# Patient Record
Sex: Female | Born: 1950 | Race: White | Marital: Single | Smoking: Never smoker
Health system: Northeastern US, Academic
[De-identification: ages and names within clinical notes are randomized; demographics above are authoritative.]

## PROBLEM LIST (undated history)

## (undated) DIAGNOSIS — J449 Chronic obstructive pulmonary disease, unspecified: Secondary | ICD-10-CM

## (undated) DIAGNOSIS — K589 Irritable bowel syndrome without diarrhea: Secondary | ICD-10-CM

## (undated) DIAGNOSIS — E119 Type 2 diabetes mellitus without complications: Secondary | ICD-10-CM

## (undated) DIAGNOSIS — M549 Dorsalgia, unspecified: Secondary | ICD-10-CM

## (undated) DIAGNOSIS — I10 Essential (primary) hypertension: Secondary | ICD-10-CM

## (undated) DIAGNOSIS — M1990 Unspecified osteoarthritis, unspecified site: Secondary | ICD-10-CM

## (undated) DIAGNOSIS — K219 Gastro-esophageal reflux disease without esophagitis: Secondary | ICD-10-CM

## (undated) DIAGNOSIS — N200 Calculus of kidney: Secondary | ICD-10-CM

## (undated) DIAGNOSIS — K760 Fatty (change of) liver, not elsewhere classified: Secondary | ICD-10-CM

## (undated) DIAGNOSIS — E079 Disorder of thyroid, unspecified: Secondary | ICD-10-CM

## (undated) DIAGNOSIS — G8929 Other chronic pain: Secondary | ICD-10-CM

## (undated) HISTORY — DX: Gastro-esophageal reflux disease without esophagitis: K21.9

## (undated) HISTORY — DX: Calculus of kidney: N20.0

## (undated) HISTORY — PX: OVARY REMOVAL: SHX86

## (undated) HISTORY — PX: KNEE SURGERY: SHX244

## (undated) HISTORY — DX: Fatty (change of) liver, not elsewhere classified: K76.0

## (undated) HISTORY — DX: Irritable bowel syndrome without diarrhea: K58.9

## (undated) HISTORY — DX: Type 2 diabetes mellitus without complications: E11.9

## (undated) HISTORY — DX: Irritable bowel syndrome, unspecified: K58.9

## (undated) HISTORY — DX: Disorder of thyroid, unspecified: E07.9

## (undated) HISTORY — DX: Chronic obstructive pulmonary disease, unspecified: J44.9

## (undated) HISTORY — DX: Unspecified osteoarthritis, unspecified site: M19.90

## (undated) HISTORY — DX: Other chronic pain: G89.29

## (undated) HISTORY — PX: ACHILLES TENDON SURGERY: SHX542

## (undated) HISTORY — DX: Dorsalgia, unspecified: M54.9

## (undated) HISTORY — DX: Essential (primary) hypertension: I10

## (undated) HISTORY — PX: HYSTERECTOMY: SHX81

---

## 2009-07-02 ENCOUNTER — Emergency Department
Admit: 2009-07-02 | Disposition: A | Discharge status: Home / Self Care | Payer: Self-pay | Source: Ambulatory Visit | Attending: Emergency Medicine | Admitting: Emergency Medicine

## 2009-07-02 LAB — CBC AND DIFFERENTIAL
Baso # K/uL: 0 THOU/uL (ref 0.0–0.1)
Basophil %: 0 % (ref 0.1–1.2)
Eos # K/uL: 0 THOU/uL (ref 0.0–0.4)
Eosinophil %: 0 % — ABNORMAL LOW (ref 0.7–5.8)
Hematocrit: 39 % (ref 34–45)
Hemoglobin: 13.1 g/dL (ref 11.2–15.7)
Lymph # K/uL: 1.4 THOU/uL (ref 1.2–3.7)
Lymphocyte %: 12 % — ABNORMAL LOW (ref 19.3–51.7)
MCV: 88 fL (ref 79–95)
Mono # K/uL: 0.4 THOU/uL (ref 0.2–0.9)
Monocyte %: 4 % — ABNORMAL LOW (ref 4.7–12.5)
Neut # K/uL: 8.6 THOU/uL — ABNORMAL HIGH (ref 1.6–6.1)
Platelets: 125 THOU/uL — ABNORMAL LOW (ref 160–370)
RBC: 4.5 MIL/uL (ref 3.9–5.2)
RDW: 13.4 % (ref 11.7–14.4)
Seg Neut %: 83 % — ABNORMAL HIGH (ref 34.0–71.1)
WBC: 10.4 THOU/uL — ABNORMAL HIGH (ref 4.0–10.0)

## 2009-07-02 LAB — COMPREHENSIVE METABOLIC PANEL
ALT: 60 U/L — ABNORMAL HIGH (ref 0–35)
AST: 46 U/L — ABNORMAL HIGH (ref 0–35)
Albumin: 4.2 g/dL (ref 3.5–5.2)
Alk Phos: 145 U/L — ABNORMAL HIGH (ref 35–105)
Anion Gap: 13 (ref 7–16)
Bilirubin,Total: 1.4 mg/dL — ABNORMAL HIGH (ref 0.0–1.2)
CO2: 29 mmol/L — ABNORMAL HIGH (ref 20–28)
Calcium: 8.9 mg/dL (ref 8.6–10.2)
Chloride: 97 mmol/L (ref 96–108)
Creatinine: 1.26 mg/dL — ABNORMAL HIGH (ref 0.51–0.95)
GFR,Black: 53 * — AB
GFR,Caucasian: 43 * — AB
Globulin: 2.3 g/dL — ABNORMAL LOW (ref 2.7–4.3)
Glucose: 109 mg/dL — ABNORMAL HIGH (ref 74–106)
Lab: 15 mg/dL (ref 6–20)
Potassium: 3.4 mmol/L (ref 3.3–5.1)
Sodium: 139 mmol/L (ref 133–145)
Total Protein: 6.5 g/dL (ref 6.3–7.7)

## 2009-07-02 LAB — REACTIVE LYMPHS: React Lymph %: 1 % (ref 0.0–6.0)

## 2009-07-02 LAB — BLOOD BANK HOLD PINK

## 2009-07-02 LAB — HOLD BLUE

## 2009-07-02 LAB — DIFF BASED ON: Diff Based On: 100 CELLS

## 2009-07-02 LAB — SLIDE NUMBER: Slide # (Heme): 1983

## 2009-07-02 LAB — HOLD GREEN WITH GEL

## 2009-07-02 LAB — MANUAL DIFFERENTIAL

## 2009-07-08 LAB — BLOOD CULTURE
Bacterial Blood Culture: NO GROWTH
Bacterial Blood Culture: NO GROWTH

## 2009-09-24 ENCOUNTER — Ambulatory Visit: Payer: Self-pay | Admitting: Orthopedic Surgery

## 2009-09-24 ENCOUNTER — Ambulatory Visit: Payer: Self-pay

## 2009-09-29 NOTE — Progress Notes (Signed)
 CHIEF COMPLAINT:  Lisa Ferguson is a 59 year old female who [facility] consultation in regards to her left great toe.    HISTORY OF PRESENT ILLNESS:  The patient states that she has had left great  toe pain over the last year.  It is aggravated by activity.  It is relieved  by rest.  She has had normal treatment to date for it.  She states her pain  currently is 1/10.  At its worst, it is a 7/10.  It is localized to the  great toe.  It is achy in nature.    PAST MEDICAL HISTORY, PAST SURGICAL HISTORY, MEDICATIONS, ALLERGIES, SOCIAL  HISTORY, FAMILY HISTORY, REVIEW OF SYSTEMS:  Reviewed in the clinic today  and entered in the patient medical record via the patient questionnaire.  She is a known diabetic.  She has been on insulin over the last 1 to 2  months.  A post hemoglobin A-1-C was 9, however, more recently since  starting on insulin, her hemoglobin A-1-C is now down to around 7.  She is  a nonsmoker.  She is not limited in her walking by her toe, and she  exercises irregularly.    PHYSICAL EXAMINATION:  On examination, she is an obese female.  She is in  no acute distress in the clinic today.  She is alert and oriented and  cooperative with the exam.  Standing alignment reveals that she has  bilateral hindfoot valgus, more pronounced on the left than on the right.  She has collapsed through her midfoot bilaterally.  She has palpable pulses  bilaterally.  Feet are neurovascularly intact to light touch sensation.  There is no ulceration on either foot.  Examination of her left foot  reveals callus formation at the IP joint of her great toe.  She has some  reduced motion at the MTP joint of the left toe with mild crepitus.  Today,  this is nonpainful.  She has a dorsal medial prominence of the left great  toe.  This is minimally tender to palpation today.  She has dorsiflex to  neutral with her hindfoot corrected which does improve with her knee  flexed.    IMAGING STUDIES:   Weightbearing x-rays of the left foot were performed  today.  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  They also show  the flatfoot deformity.    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    PLAN:  We have prescribed for her diabetic insoles and she has seen the  Prosthetics and Orthotics Department here to obtain the insoles.  We have  advised her on appropriate footwear and foot care for diabetes.  She will  follow up with [place] on a p.r.n. basis.    The patient was seen and assessed with Dr. Theador Hawthorne and patient care plan  agreed upon.        Dictated by:  Michae Kava, MD,FEL      I saw and evaluated the patient.  I agree with the resident's/fellow's  findings and plan of care as documented above.    Electronically Signed and Finalized  by  Zachary George, MD 09/29/2009  07:29  ___________________________________________  Zachary George, MD      DD:   09/24/2009  DT:   09/24/2009 10:38 A  [id number]  [id number]    cc:   Shane Crutch, MD

## 2013-03-24 ENCOUNTER — Ambulatory Visit: Payer: Self-pay | Admitting: Orthopedic Surgery

## 2013-03-24 ENCOUNTER — Encounter: Payer: Self-pay | Admitting: Orthopedic Surgery

## 2013-03-24 ENCOUNTER — Ambulatory Visit
Admit: 2013-03-24 | Discharge: 2013-03-24 | Disposition: A | Discharge status: Home / Self Care | Payer: Self-pay | Source: Ambulatory Visit | Attending: Orthopedic Surgery | Admitting: Orthopedic Surgery

## 2013-03-24 VITALS — Ht 70.0 in | Wt 235.0 lb

## 2013-03-24 DIAGNOSIS — M1610 Unilateral primary osteoarthritis, unspecified hip: Secondary | ICD-10-CM

## 2013-03-24 DIAGNOSIS — M25559 Pain in unspecified hip: Secondary | ICD-10-CM

## 2013-03-24 NOTE — Progress Notes (Signed)
ADULT NEW HIP PAIN    Lisa Ferguson presents today for a new patient visit with complaints of right hip pain. Pain has been present for 2 years.  The pain is located lateral and groin, buttock. It is exacerbated with activity, and relieved by rest. Treatment modalities so far areinjection, medication and therapy. The pain is worse with time.  There  is pain at rest. Pain is rated as a 5 out of 10 when at its worst.  There is not a history of prior injury and previous problems.  Patient complains of back pain.  Radiographs have been taken previously.     Past Medical History: History reviewed. No pertinent past medical history.      Past Surgical History: History reviewed. No pertinent past surgical history.     Medications: No current outpatient prescriptions on file.     Allergies:   Allergies   Allergen Reactions   . 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     Review of Systems: Review of Systems    Constitutional        []  Fever        []  Chills        []  Loss of Appetite         []  Unexplained Weight Loss        []  Unusual Tiredness        []  Night Pains    Ears, Nose, Mouth, Throat        []  Dizziness        []  Mouth Sores        []  Sore Throat    Cardiovascular        [x]  High Blood Pressure        []  Angina        []  Trouble Breathing         []  Trouble Breathing When Flat        []  Ankle Swelling        []  Heart Attack        []  Congestive Heart Failure        []  Mitral Valve Prolapse        []  Abnormal Heart Rhythm        []  Heart Murmur    Respiratory        []  Heavy Cough        []  Cough Up Sputum        []  Cough Up Blood         []  Pneumonia        []  Asthma    Neurological        []  Fainting        []  Epilepsy        []  Stroke         []  Memory  Problems    Eyes        []  Abnormal Vision        []  Glasses or Contacts    Gastrointestinal        []  Nausea        []  Stomach Pain        []   Vomiting         []  Vomiting Blood        []  Ulcers        []  Hiatal Hernia        []  Constipation        []  Diarrhea        []  Change in Bowel Habits        []  Blood in Stool        []  Black, Tarry Stools        []  Reflux        []  GERD    Genitourinary        []  Painful Urination        []  Impotence        []  One Kidney         []  Kidney Failure        []  Dialysis        []  Kidney Transplant        []  Change in Bladder Habits        []  Incontinence        []  Prostate Cancer    Musculoskeletal        []  Painful Joints        []  Swollen Joints        []  Redness of Joints        []  Joint Infection        []  Bone Infection        []  Gout        []  Osteoarthritis        []  Rheumatoid Arthritis        []  Ankylosing Spondylitis        []  Osteoporosis    Skin        []  Skin Sores        []  Skin Rash        []  Itching        []  Skin Cancer    Psychiatric        []  Depression        []  Anxiety    Endocrine        [x]  Diabetes        [x]  Thyroid Problem    Hematologic/Lymphatic        []  Unusual Bleeding        []  Easy Bruising        []  Mass        []  Swollen Glands        []  Anemia        []  Infection        []  Immunodeficiency        []  Hepatitis        []  Cancer       Exam: This patient is pleasant and co-operative,alert and oriented x3, and height/weight per epic. Normocephalic, atraumatic, neck symmetric. Vital signs per chart.   There isnormal alignment of the limb.  Swelling is absent.  There is tenderness to palpation .  Range of motion is full.  Leg lengths are even.  Sensation is  normal  Circulation is normal. Color to skin is normal.     Radiographs are personally reviewed and reveal no abnormality     Impression:  Ongoing pain  right hip.        Plan:    MRI - call following test.    Orders Next Visit:

## 2013-04-11 ENCOUNTER — Telehealth: Payer: Self-pay | Admitting: Orthodontics

## 2013-04-11 NOTE — Telephone Encounter (Signed)
Can you please call patient with MRI results she can be reached at [phone]

## 2013-04-12 ENCOUNTER — Telehealth: Payer: Self-pay | Admitting: Orthodontics

## 2013-04-12 DIAGNOSIS — M25559 Pain in unspecified hip: Secondary | ICD-10-CM

## 2013-04-12 NOTE — Telephone Encounter (Signed)
Patient had MRI done want to know the results can you call her at [phone]

## 2013-04-12 NOTE — Telephone Encounter (Signed)
MRI results of R hip reviewed and RX for PT given to pt.

## 2013-04-25 ENCOUNTER — Ambulatory Visit: Payer: Self-pay | Admitting: Rehabilitative and Restorative Service Providers"

## 2013-04-25 DIAGNOSIS — M25559 Pain in unspecified hip: Secondary | ICD-10-CM

## 2013-04-25 NOTE — Progress Notes (Signed)
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 female who is present today for right hip care.   Mechanism of injury/history of symptoms:  No specific cause  Reports her hip has been bothering her for a couple of months and it just was not going away.  Walking and stairs bother her.  Attempted chiropractor and massage therapy, had an injection and only helped for a little bit.  Had MRI which showed the glut tears.   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, Gardening/Yard Work and Psychiatric nurse): Golf and Walking  Other: NA  Diagnostic tests: MRI    Symptom location: Posterior and Anterior, right  Relevant symptoms:  Aching, Pain   Symptom frequency: Constant  Symptom intensity:  (0 - 10 scale): Now 5 Best 2 Worst 8   Night Pain: No   Restful sleep:   Yes  Morning Pain/Stiffness: Unchanged   Symptoms worsen with: Stairs, Walking  Symptoms improve with: Rest, Ice, Medication  Assistive device:  none  Patient's goals for therapy: Reduce pain, Increase strength, Independent with home program     Objective    Observation: WNL  Gait:  Normal    Lumbar Screen:  WNL  Neurologic:  Sensation:  Light touch, Intact to gross screen    Palpation:  tenderness @ lateral aspect of hip  Incision:  NA      ROM/Strength      HIP LEFT RIGHT STRENGTH    PROM AROM PROM AROM Left Right   Flexion      5 5   Extension     5 5   Abduction     5 3+   IR WNL WNL   4+ 4+   ER WNL WNL   4+ 4+   Adduction     5 5                     SINGLE LEG STANDING BALANCE (# contacts / 30 sec)    LEFT:    0 contacts / eyes open    RIGHT:  0 contacts / eyes open      Special Tests:   Hip Patrick's / Pearlean Brownie,   negative  Straight Leg Raise,  negative  FADIR,  negative  Positive trendelenberg,   (+)HS tightness, (-)Ober   Knee N/A   Ankle N/A          Functional:  Walk more than 15 minutes - unable to perform without increased pain.  Ascend stairs with reciprocal gait - unable to perform without increased pain.  Descend stairs with reciprocal gait - unable to perform without increased pain.    Assessment:    Findings consistent with 62 y.o., female with right hip partial glut med/min tears with pain, ROM limitations, strength limitations, functional limitations    Prognosis:  Good   Contraindications/Precautions/Limitation:  Per diagnosis  Short Term Goals: (4 week(s)): Decrease c/o pain by 50% with ADL and exercise , Increase strength 4+/5 in all directions and Minimal assistance with HEP/ education concepts  Long Term Goals: (3 month(s)): Pain/Sx 0 - minimal, ROM/  flexibility WNL , Restoration of functional strength, Independent with HEP/education , Functional return to ADLs / activites without limitation     Treatment Plan:   Options / plan reviewed with patient:  Yes  Freq 1-2 times per week for 3 month(s)    Treatment plan inclusive of:   Exercise: AROM, AAROM, PROM, Stretching, Progressive Resistive   Manual Techniques:  N/A   Modalities:  Cold pack, Ther Exercise per flowsheet   Functional: Proprioception/Dynamic stability, Functional rehab    Thank you for referring this patient to [facility].    Rueben Bash Chriss Mannan, PT    Hamstring stretch with strap 3 x 30''   Up and over stretch 3 x 30''   Supine piriformis stretch 3 x 30''   Curl up x10 each   Hip abd iso with belt x10   Bridge  x5   clams x10

## 2013-05-03 ENCOUNTER — Ambulatory Visit: Payer: Self-pay | Admitting: Rehabilitative and Restorative Service Providers"

## 2013-05-03 DIAGNOSIS — M25559 Pain in unspecified hip: Secondary | ICD-10-CM

## 2013-05-03 NOTE — Progress Notes (Signed)
[facility]  PT Note    Lisa Ferguson   [id number]     Diagnosis: Right hip partial tear of glut med/min, degenerative labral tearing    Subjective:  Pain Score:  3  Pain: Improved, reports some days are better than others.  Notices when she overdoes it.    Objective:  ROM - Improved, Right, Hip, flexibility as seen grossly  Strength - Improved,  Per function, Per Ther Ex flowsheet, Per patient report  Function: - Improved  Education:  Updated HEP, Verbal cues for ther ex, Manual cues for ther ex    Objective         Treatment:  Ther Exercise per flowsheet, denies ice    Assessment:  Patient responding favorably, Continue present therapy treatment   Patient tolerated progressions with exercises well.  She will be out of town for the next two and a half weeks and follow up when she returns.  She was instructed to call with any questions or concerns.        Plan of Care:  Continue per Plan of care -  As written    Thank you for referring this patient to [facility]    [employer], PT        Bike  10 min   Hamstring stretch with strap 3 x 30''   Up and over stretch 3 x 30''   Supine piriformis stretch 3 x 30''   Curl up x10 each   Hip abd iso with belt 3x10   Bridge with belt x15   clams 3x10   sidelying hip abd 2 x 10   Prone extension 2 x 10   Mini squat 2 x 10

## 2013-05-24 ENCOUNTER — Ambulatory Visit: Payer: Self-pay | Admitting: Rehabilitative and Restorative Service Providers"

## 2013-05-30 ENCOUNTER — Ambulatory Visit: Payer: Self-pay | Admitting: Rehabilitative and Restorative Service Providers"

## 2013-05-30 DIAGNOSIS — M25559 Pain in unspecified hip: Secondary | ICD-10-CM

## 2013-05-30 NOTE — Progress Notes (Signed)
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 Has been traveling over the past month so has been unable to attend PT visits.    Objective:  ROM - Improved, Right, Hip, flexibility as seen grossly  Strength - Improved,  Per function, Per Ther Ex flowsheet, Per patient report  Function: - Improved  Education:  Updated HEP, Verbal cues for ther ex, Manual cues for ther ex    Objective         Treatment:  Ther Exercise per flowsheet, denies ice    Assessment:  Patient responding favorably, Continue present therapy treatment   Progressed exercises as below.  Patient tolerated well.  Will continue to progress as tolerated.  She will be out of town over the next 2 weeks and will follow up upon her return.       Plan of Care:  Continue per Plan of care -  As written    Thank you for referring this patient to [facility]    [employer], PT        Bike  10 min   Hamstring stretch with strap 3 x 30''   Up and over stretch 3 x 30''   Supine piriformis stretch 3 x 30''   Curl up x10 each   Hip abd iso with belt 3x10   Bridge with belt x15   clams 3x10   sidelying hip abd 2 x 10   Prone extension 2 x 10   Mini squat 2 x 10   SLB 4 x 30''   Step up/down 4 inch 2 x 10   Lateral step up 4 inch 2 x 10

## 2013-08-17 ENCOUNTER — Encounter: Payer: Self-pay | Admitting: Rehabilitative and Restorative Service Providers"

## 2013-08-17 NOTE — Progress Notes (Signed)
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 in HEP  Other:  NA    REASON FOR DISCHARGE:  Patient did not return for follow-up     Prognosis at time of discharge:  good  Comments:  NA    Discharge planning:  Discussion regarding the maintenance of appropriate exercise and activity as part of a healthy lifestyle.  Optional utilization of Post-Rehab Conditioning Program [facility]) for transition and/or additional exercise/activity support    Thank you for the referral of this patient to [facility].    Isabella Bowens, PT

## 2016-11-11 ENCOUNTER — Encounter: Payer: Self-pay | Admitting: Gastroenterology

## 2017-02-03 ENCOUNTER — Ambulatory Visit: Payer: Medicare (Managed Care) | Attending: Pediatrics | Admitting: Obstetrics and Gynecology

## 2017-02-03 ENCOUNTER — Encounter: Payer: Self-pay | Admitting: Obstetrics and Gynecology

## 2017-02-03 VITALS — BP 130/70 | Ht 69.0 in | Wt 236.0 lb

## 2017-02-03 DIAGNOSIS — E119 Type 2 diabetes mellitus without complications: Secondary | ICD-10-CM | POA: Insufficient documentation

## 2017-02-03 DIAGNOSIS — N3941 Urge incontinence: Secondary | ICD-10-CM

## 2017-02-03 MED ORDER — OXYBUTYNIN CHLORIDE 5 MG PO TB24 *I*
5.0000 mg | ORAL_TABLET | Freq: Every day | ORAL | 2 refills | Status: AC
Start: 2017-02-03 — End: ?

## 2017-02-03 NOTE — Patient Instructions (Signed)
Behavioral management for urge incontinence:   1. Avoid alcohol and caffeine- void more frequently after drinking these types of beverages. Goal is no more than one caffeinated beverage per day.   2. Void every 2 hours during the daytime. Do not wait longer than 2 hours to void during the daytime- never longer than 3 hours during the daytime.   3. Normalize fluid intake to 50 ounces per day on average. Drinking more fluid than you need will result in greater urinary urgency and frequency. You may need more fluid if you are participating in strenuous exercise. You do not need to increase your fluid intake until you have clear urine.   4. Use pelvic floor muscle contractions to suppress urinary urgency. When you feel a strong urge to urinate, sit or stand still and do several pelvic floor muscle contractions until you feel the urgency has decreased enough that you will be able to get to the bathroom without leaking. If you get an urge to urinate when you come home or when you hear running water, try and do pelvic floor muscle contractions before you enter the house or turn on the water to help prevent severe urgency. This will take some practice.

## 2017-02-03 NOTE — H&P (Signed)
UROGYNECOLOGY NEW PATIENT VISIT    Chief Complaint:  Chief Complaint   Patient presents with    New Patient Visit     Incontinence        History of Present Illness:    Lisa Ferguson is a 66 y.o. woman who is presenting for an initial office evaluation with complaints of Urinary incontinence.  The patient reports leaking urine with the urge to urinate.  She is bothered by needing to rush to the bathroom when approaching home after being out.  She has been wearing a thick pad to protect her clothing from urine leakage.  In the past, she has weak urine with coughing, laughing and sneezing but this only happens rarely.  The patient reports 1-2 episodes of nocturia a night.  She denies fecal incontinence.  She feels as if she can empty her bladder completely and denies urinary tract infections.  She denies any symptoms related to post-hysterectomy vaginal prolapse.  She is most bothered by a strong feeling of urgency.  In the past, she has tried exercise and weight loss to control her urinary incontinence.  She has not been on any medications in the past.    Void Diary:  # voids per 24 hr:7  Amount of void range: 4 ounces -14 ounces  Ave void volume:10 ounces   Void interval range:1 hour -4 hours   Nocturia episodes:1   Intake/24hr:77 ounces   Output/24hr:55 ounces   Bladder Irritants:tea  # incontinence epidoses/24 hr:  7, associated with walking to the bathroom.    Current Medical Problems  There is no problem list on file for this patient.    Past Medical History:   Past Medical History:   Diagnosis Date    Arthritis     Chronic back pain     COPD (chronic obstructive pulmonary disease)     Diabetes     Fatty liver     GERD (gastroesophageal reflux disease)     Hypertension     IBS (irritable bowel syndrome)     Renal calculi     Thyroid disease      Past Surgical History:  Past Surgical History:   Procedure Laterality Date    ACHILLES TENDON SURGERY      HYSTERECTOMY      KNEE SURGERY      x3     OVARY REMOVAL       Allergies:    Cortisone and Flexeril [cyclobenzaprine]    Current medications:    Current Outpatient Prescriptions   Medication    amLODIPine (NORVASC) 10 MG tablet    glipiZIDE (GLUCOTROL) 5 MG 24 hr tablet    levothyroxine (SYNTHROID, LEVOTHROID) 125 MCG tablet    empagliflozin (JARDIANCE) 25 mg tablet    pravastatin (PRAVACHOL) 40 MG tablet    Magnesium 400 MG CAPS    METFORMIN HCL PO    GLIPIZIDE PO    Losartan Potassium-HCTZ (HYZAAR PO)    metoprolol (LOPRESSOR) 25 MG tablet    cholecalciferol (VITAMIN D) 1000 UNIT capsule    Multiple Vitamins-Minerals (MULTI COMPLETE PO)    LEVOTHYROXINE SODIUM PO    AMLODIPINE BESYLATE PO    aspirin 81 MG tablet    pravastatin (PRAVACHOL) 10 MG tablet     No current facility-administered medications for this visit.      Family History:    Family History   Problem Relation Age of Onset    Heart attack Paternal Grandfather     Breast cancer Maternal  Grandmother     Diabetes Maternal Grandmother     Heart attack Father     Breast cancer Mother     Diabetes Mother     Heart attack Mother      Social/Occupational History:   Social History     Social History    Marital status: Single     Spouse name: N/A    Number of children: N/A    Years of education: N/A     Occupational History    Not on file.     Social History Main Topics    Smoking status: Never Smoker    Smokeless tobacco: Never Used    Alcohol use No    Drug use: No    Sexual activity: Yes     Social History Narrative     Review of Systems:    General: No fatigue.  No weight gain.  No weight loss.  No night sweats.  No fevers.  HEENT: No hearing loss.  No vision change.  No oral sores. No difficulty swallowing.  No problems with sense of smell.  Respiratory: No shortness of breath. No cough.  No wheezing.  Cardiovascular: No palpitations.  No  syncope.  No chest pain.  Gastrointestinal: No abdominal pain. No bloating.  No change in stool color or caliber.  No chronic  diarrhea.  No constipation.  No nausea. No vomiting.  Genitourinary: As per HPI.  Endocrine: No polyuria. No polydipsia.    Hematologic: No epistaxis. No easy bruising.     No allergies. No immune diseases.  Musculoskeletal System: No muscle and joint pain.  No swelling in her legs.  Integumentary System: No skin rashes. No lesions.  No easy bruising. No bleeding.   Breast: No breast discharge. No breast pain.  Neurologic: No headaches. No numbness or weakness to extremities.  No seizures.    Psychiatric: No depression.  No anxiety.  No other psychiatric illnesses.    Vital Signs:   Vitals:    02/03/17 0911   BP: 130/70   Weight: 107 kg (236 lb)   Height: 1.753 m (5\' 9" )     Body mass index is 34.85 kg/(m^2).    PHYSICAL EXAM:  General: No apparent distress. Alert & oriented x 3. Healthy-appearing woman who looks her stated age.    CV: S1-S2, regular.    Respiratory: Clear to auscultation bilaterally.    Abdomen: Soft.  Nontender.  Nondistended.  No masses.  No hernias.   Vulva: Normal external genitalia.  Symmetric labia.  No skin lesions or pigmentation changes.  Normal external urethral meatus.    Vagina: No epithelial lesions or pigmentation changes.  No tenderness with palpation underneath the bladder or urethra.    No tenderness with palpation over the pelvic floor muscles.     Uterus: Surgically absent  Adnexa: Limited    Neurologic Exam:  Motor and sensory exams grossly normal.    Cough Stress Test: Negative, prevoid in supine position  Urethral Mobility: Borderline  Void: 150cc  Postvoid Residual Volume: 0cc by bladder scanner    POCT: ++glucose    Assessment:     Lisa Ferguson is a 66 y.o. with the following:  1.  Urinary incontinence  Urge incontinence >>>> stress  No previous medical interventions  Diabetes mellitus, hemoglobin A1c 8.6  Obstructive sleep apnea, uses Cpap machine   History of renal calculi  Void Diary:  # voids per 24 hr:7  Amount of void range: 4 ounces -14 ounces  Ave void volume:10  ounces   Void interval range:1 hour -4 hours   Nocturia episodes:1   Intake/24hr:77 ounces   Output/24hr:55 ounces   Bladder Irritants:tea  # incontinence epidoses/24 hr:  7, associated with walking to the bathroom.  2.  No post-hysterectomy vaginal prolapse  3. Surgical considerations  History of DVT 2   COPD  Obesity. BMI 34.9  Plan:   The findings on exam and the following recommendations were reviewed with the patient:  The diagnosis of urgency related incontinence is based on the patient's history, voiding diary and today's physical examination.  The patient is very aware of behavioral modifications that could be done to improve her overall health and as a result her urinary incontinence.  She has noticed that some of her new diabetes medication has increased her urinary frequency and urgency, this is most likely due to the glucosuria.  In addition, her diabetes mellitus is poorly controlled with a hemoglobin A1c of 8.6.  Regardless, it appears as if the patient could implement behavioral changes such as voiding in two hour increments and normalize her fluid intake.  She was offered medical intervention for urinary urge incontinence.  She was given a prescription for oxybutynin 5 mg by mouth daily.  Patient will implement behavioral and medical interventions and then will return to our office in 2 months.  We did discuss the potential side effect of increased constipation, dry mouth and dry eyes.  The patient expressed understanding and satisfaction with the plan.  She'll return to clinic in 2 months for continued management of urinary urge incontinence.

## 2017-03-27 ENCOUNTER — Ambulatory Visit: Payer: PRIVATE HEALTH INSURANCE | Admitting: Obstetrics and Gynecology

## 2017-05-22 ENCOUNTER — Ambulatory Visit: Payer: Medicare (Managed Care) | Admitting: Obstetrics and Gynecology

## 2021-10-21 IMAGING — DX LUMBAR SPINE 3 VIEW
1 series · 3 of 3 positions shown · non-contrast
Comparison: none

________________________________________________________________________________________________ 
LUMBAR SPINE 3 VIEW, 10/21/2021 [DATE]: 
CLINICAL INDICATION: Low back pain extending to right hip
TECHNIQUE: AP and lateral, coned-down lateral

[Series 1: AP · U · 0.14mm/px · 3 of 3 slices shown]
[im 1/3]
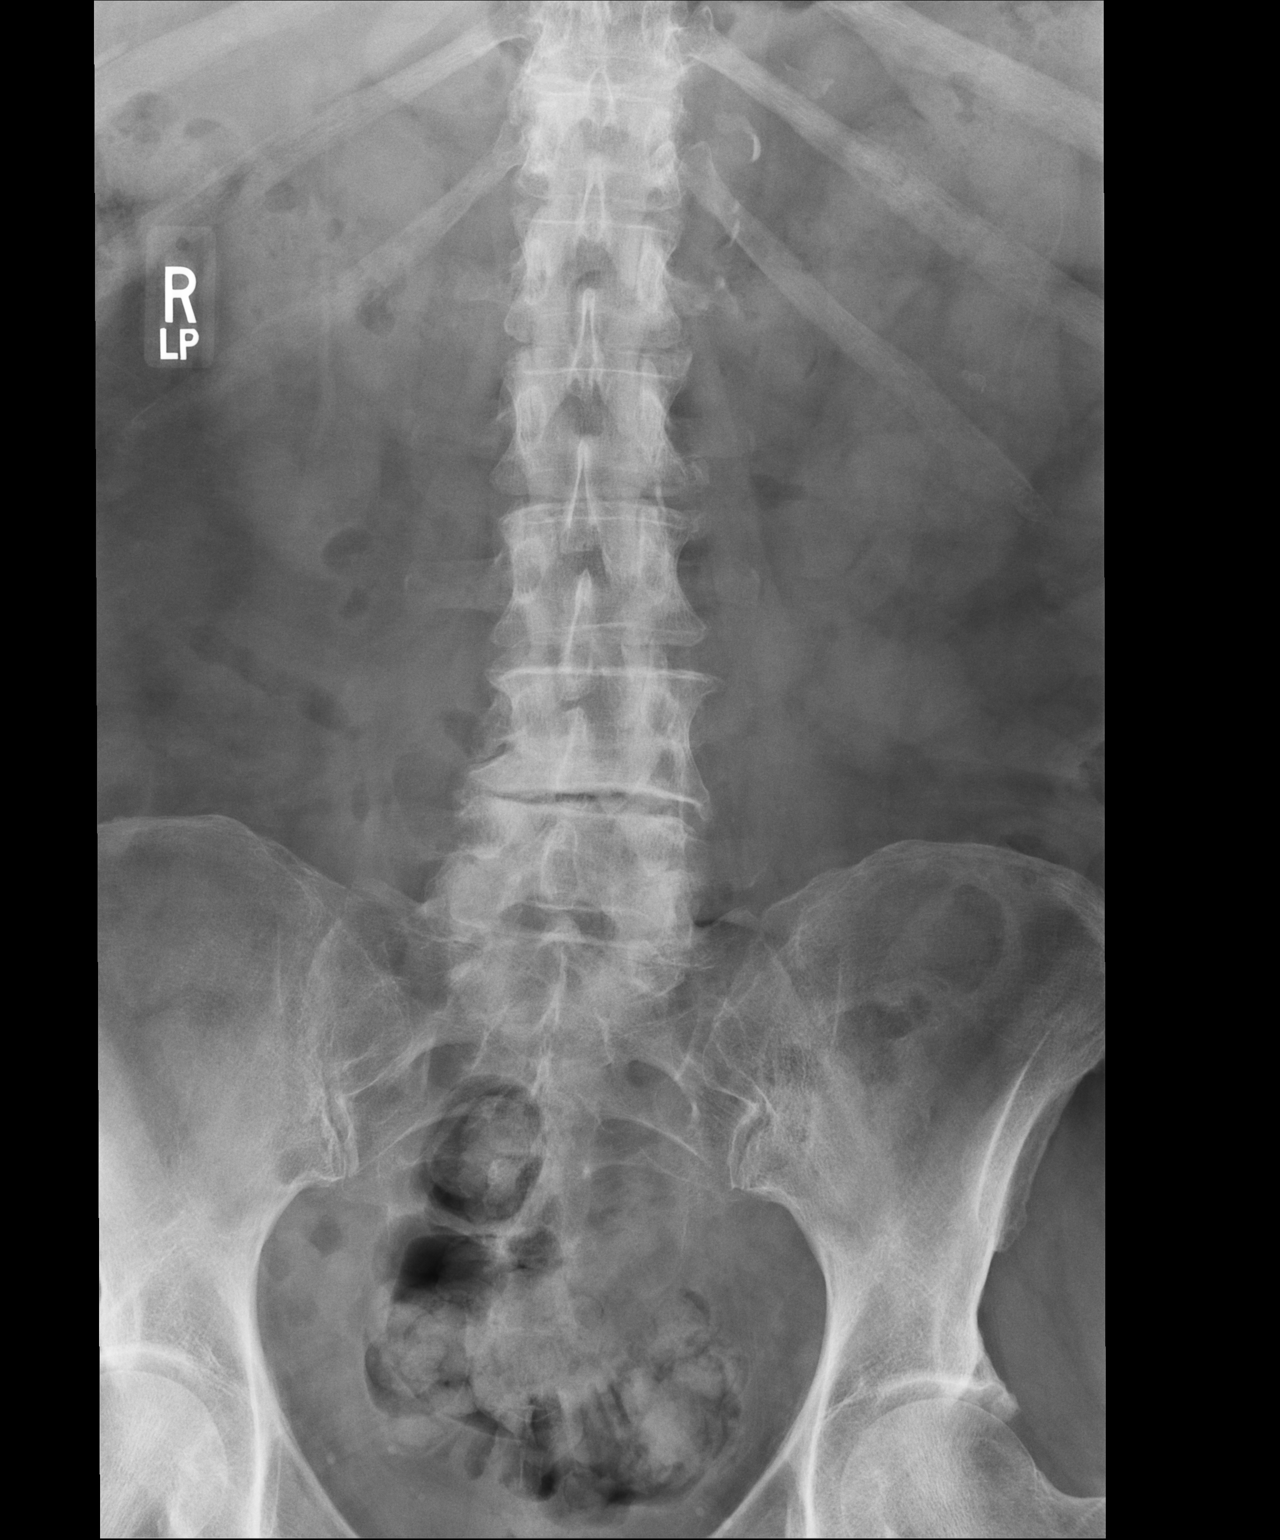
[im 2/3]
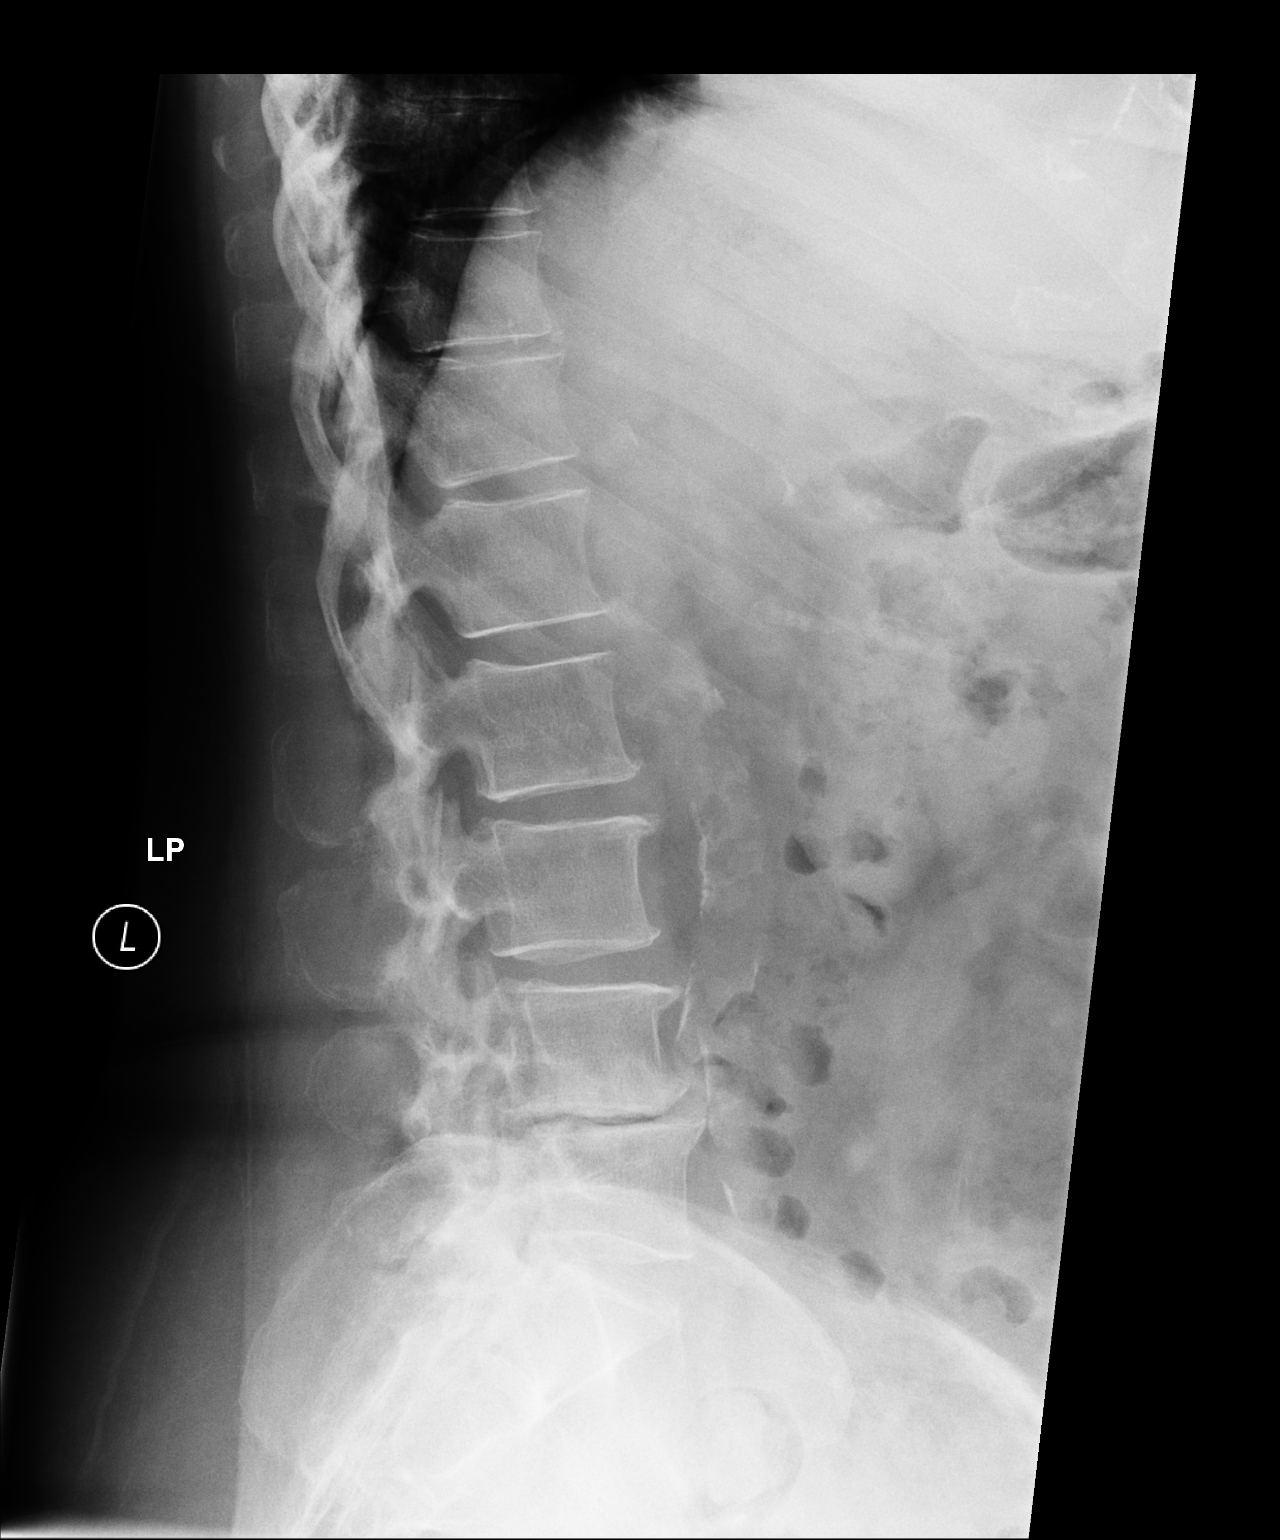
[im 3/3]
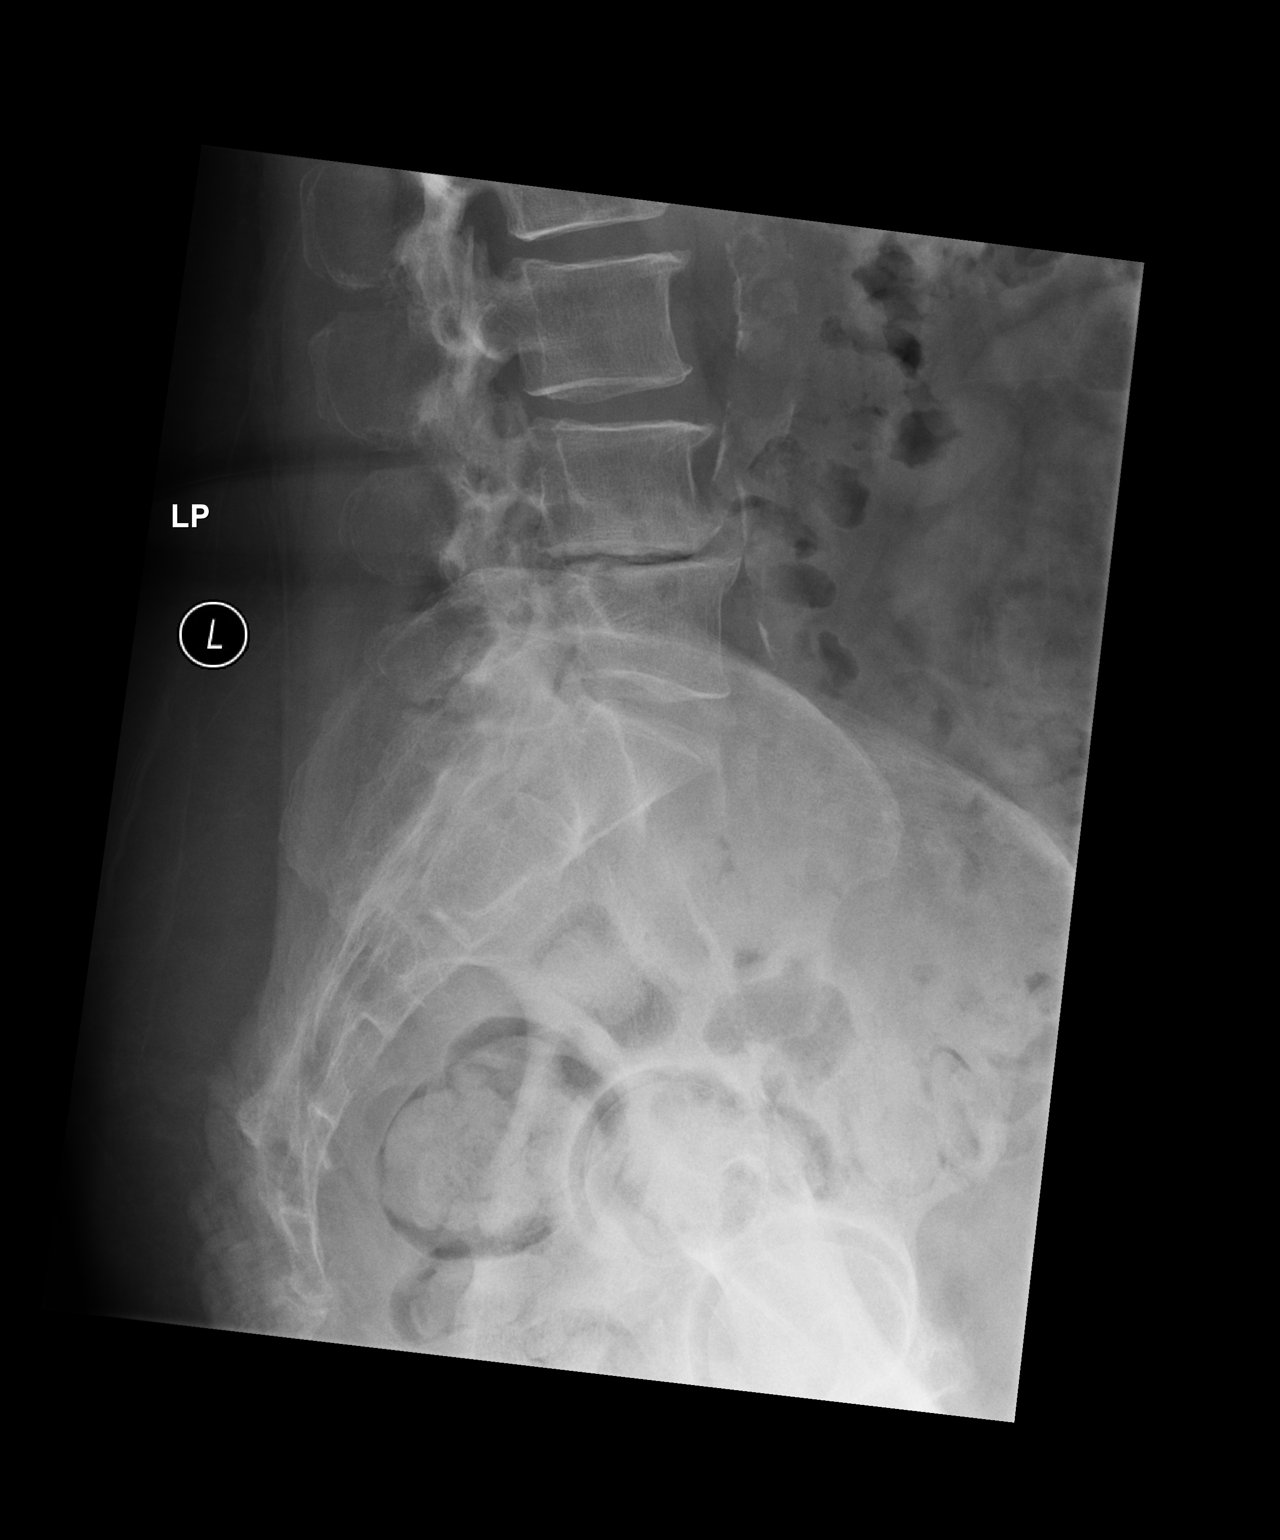

[3 of 3 positions shown; findings below may reference images not displayed]

FINDINGS: Lumbar vertebral heights are intact. Marked disc narrowing L4-5. 
Slight retrolisthesis at L4-5. Facet changes are most pronounced at the lower 2 
levels. Slight lower lumbar levocurvature. There is moderate aortic plaque.
IMPRESSION: Degenerative changes, most pronounced at L4-5. MRI would be useful to evaluate 
for stenosis.

## 2021-10-21 IMAGING — DX HIP 2 VIEWS RIGHT WITH PELVIS
1 series · 3 of 3 positions shown · non-contrast
Comparison: None.

________________________________________________________________________________________________ 
HIP 2 VIEWS RIGHT WITH PELVIS, 10/21/2021 [DATE]: 
CLINICAL INDICATION: Lumbago with sciatica.

[Series 1: AP · U · 0.14mm/px · 3 of 3 slices shown]
[im 1/3]
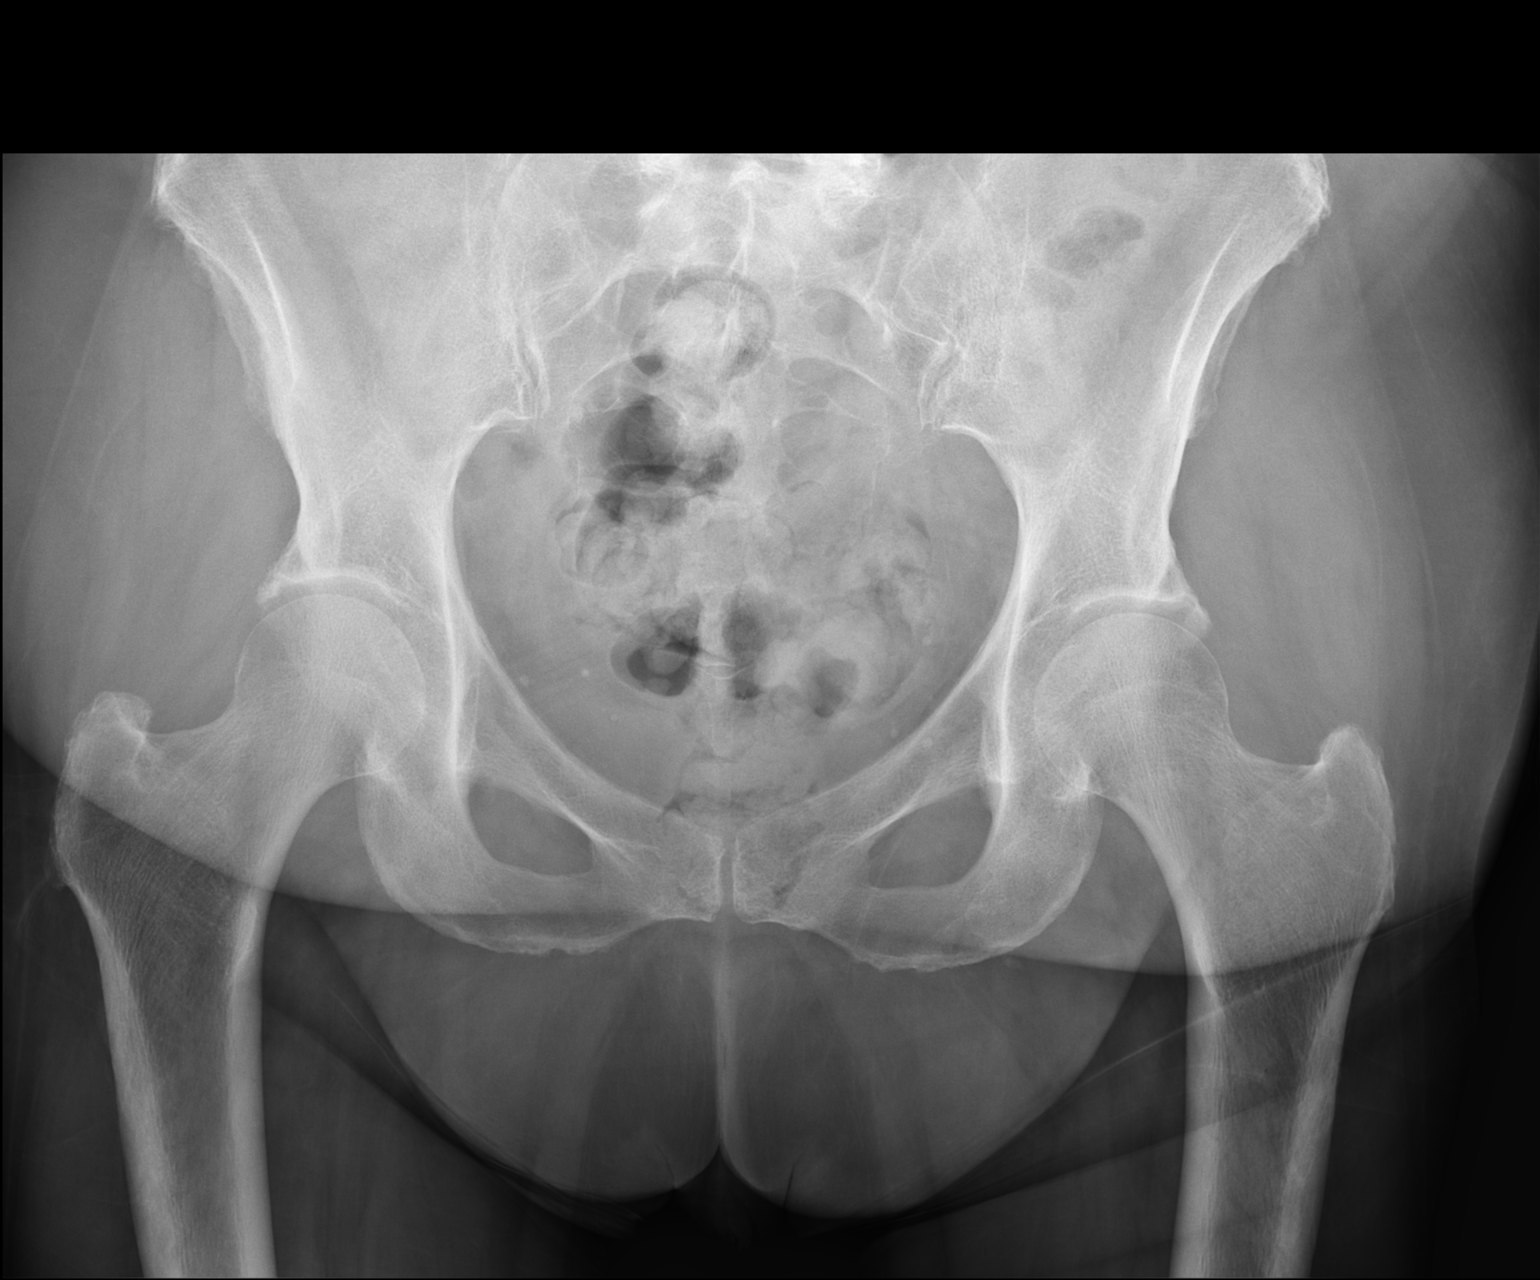
[im 2/3]
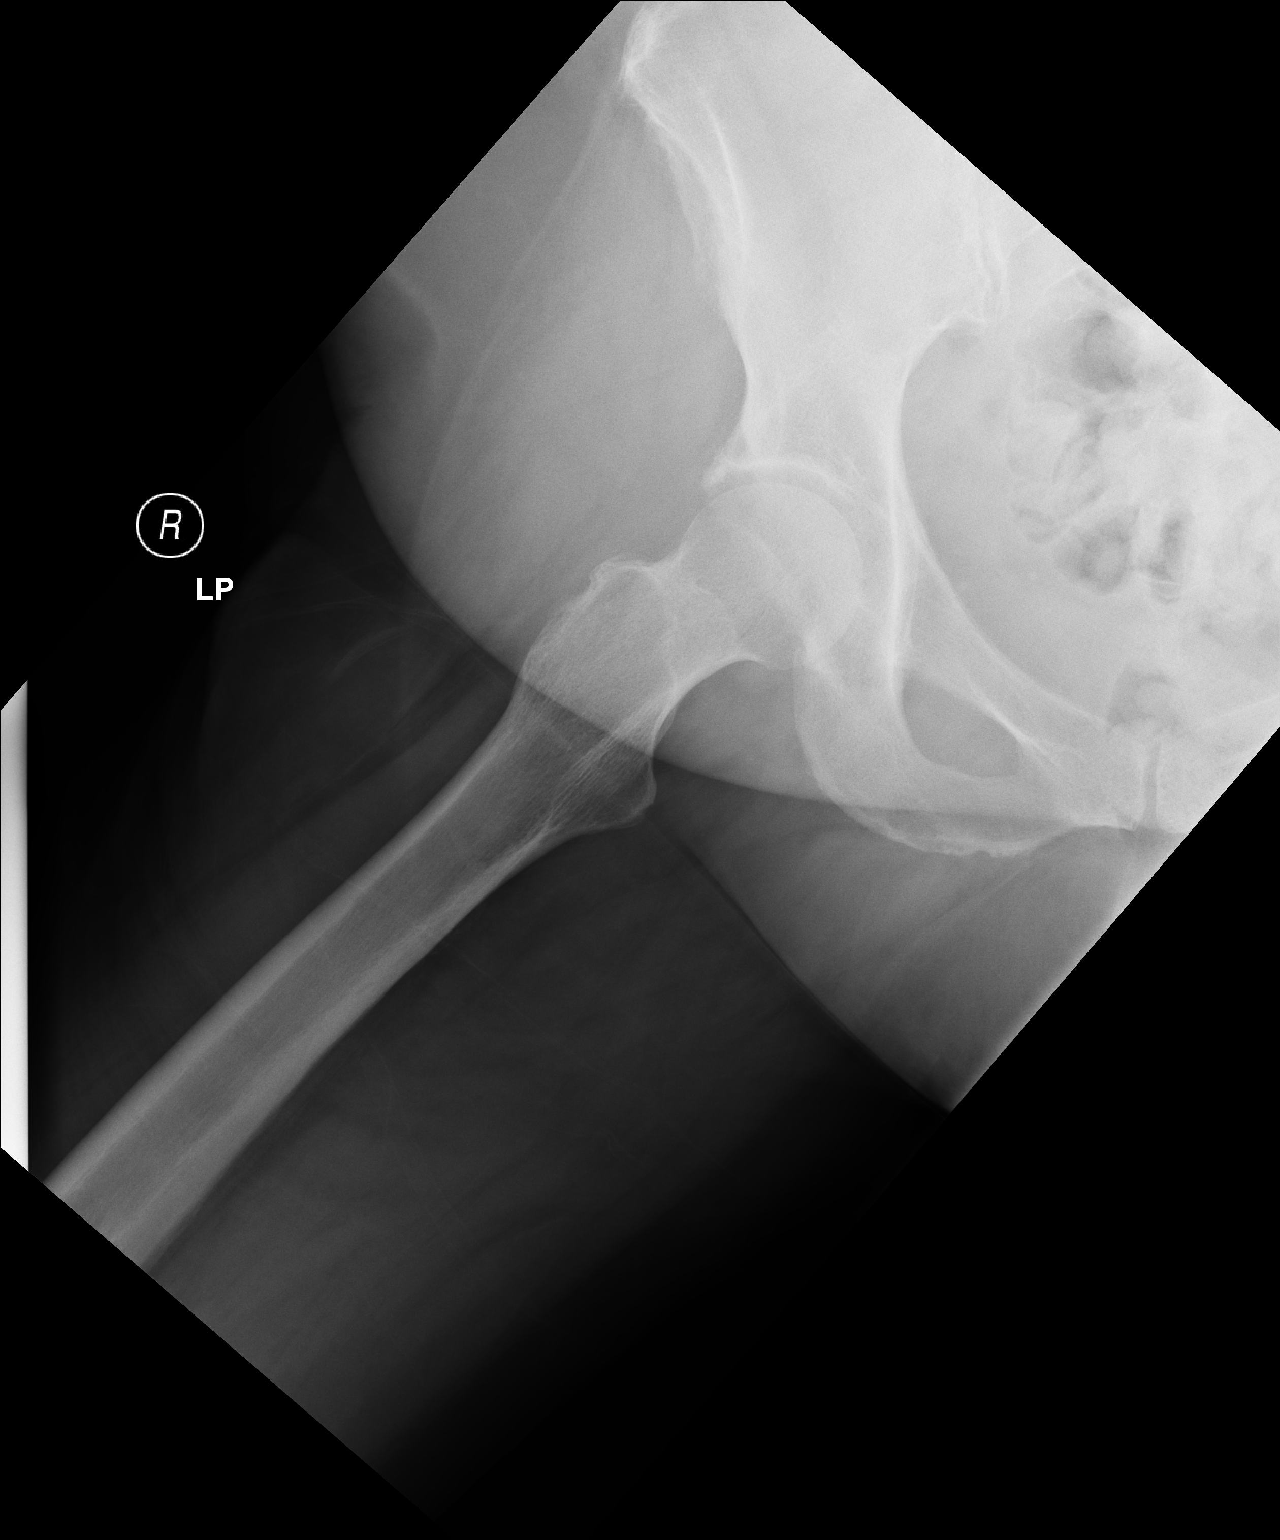
[im 3/3]
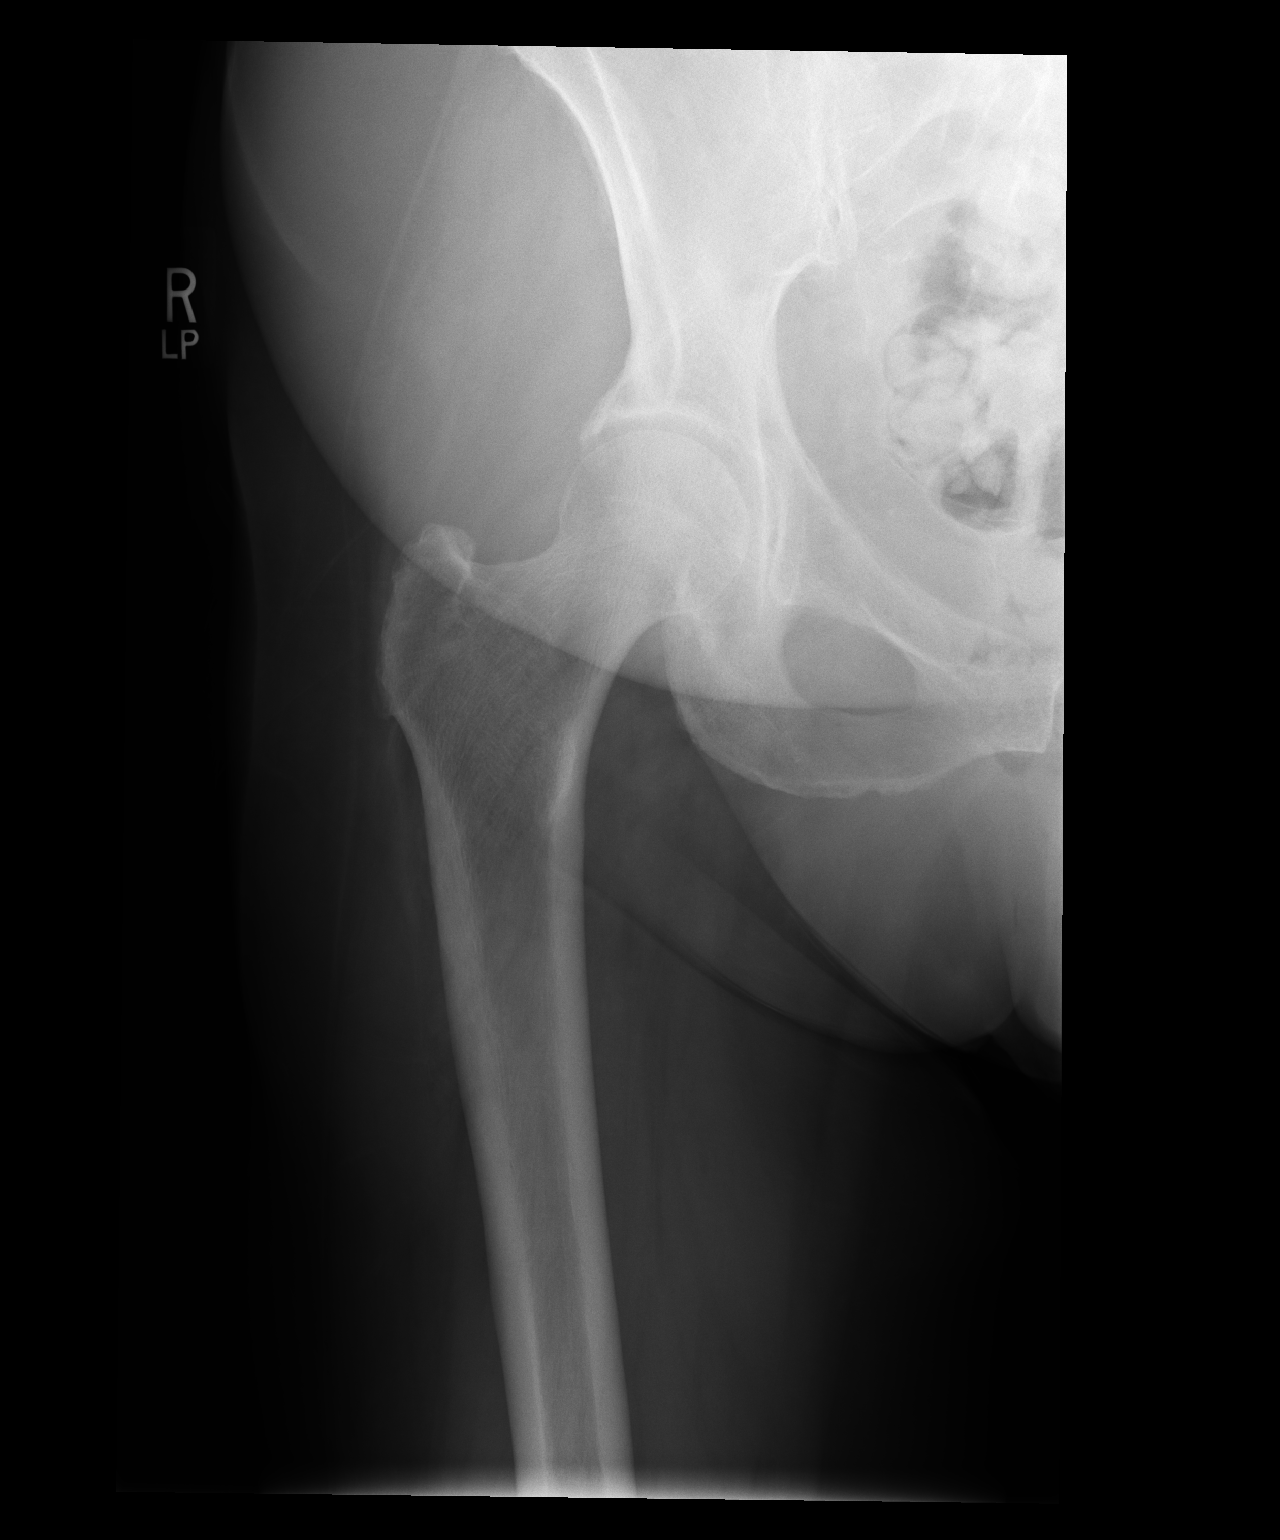

[3 of 3 positions shown; findings below may reference images not displayed]

FINDINGS: No fracture. Normal alignment. Minimal right superior hip joint space 
narrowing. Left hip joint space is preserved. Hypertrophic changes of the 
acetabula. Degenerative change of the SI joints and spine. Osteopenia. No focal 
soft tissue swelling.
IMPRESSION: Mild degenerative change of the hips.

## 2022-08-08 IMAGING — MR MRI RIGHT HIP WITHOUT CONTRAST
4 of 6 series · 16 of 40 positions shown · IV contrast (gadolinium)
Comparison: Radiograph of 10/21/2001

________________________________________________________________________________________________ 
MRI RIGHT HIP WITHOUT CONTRAST, 08/08/2022 [DATE]: 
CLINICAL INDICATION: Right hip pain.
TECHNIQUE: Multiplanar, multiecho position MR images of the hip were performed 
without intravenous gadolinium enhancement. Large field-of-view images were 
performed of the pelvis to include the contralateral hip for comparison. Patient 
was scanned on a 3T magnet.

[Series 201: T1 · axial · 5.0mm · 0.53mm/px · z∈[-174,+40]mm · 3 of 44 slices shown]
[im 5/44]
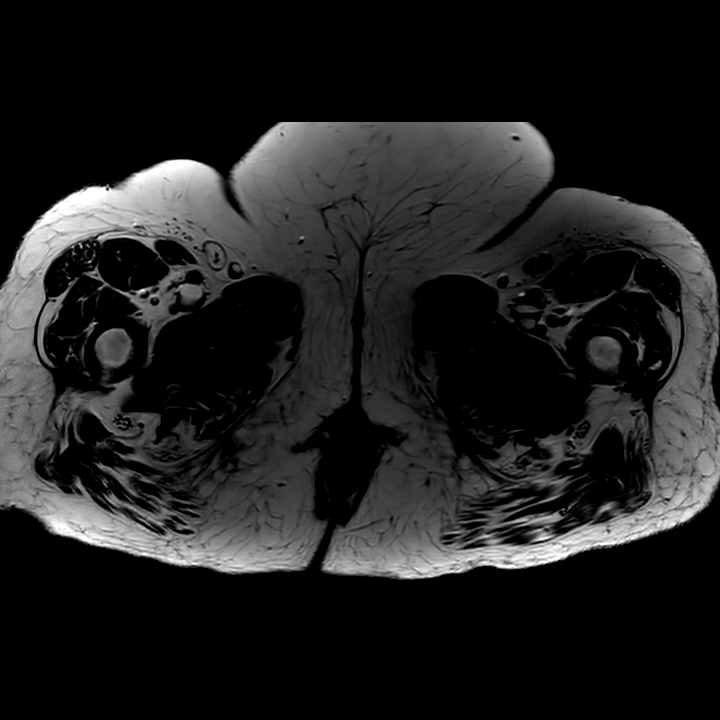
[im 24/44]
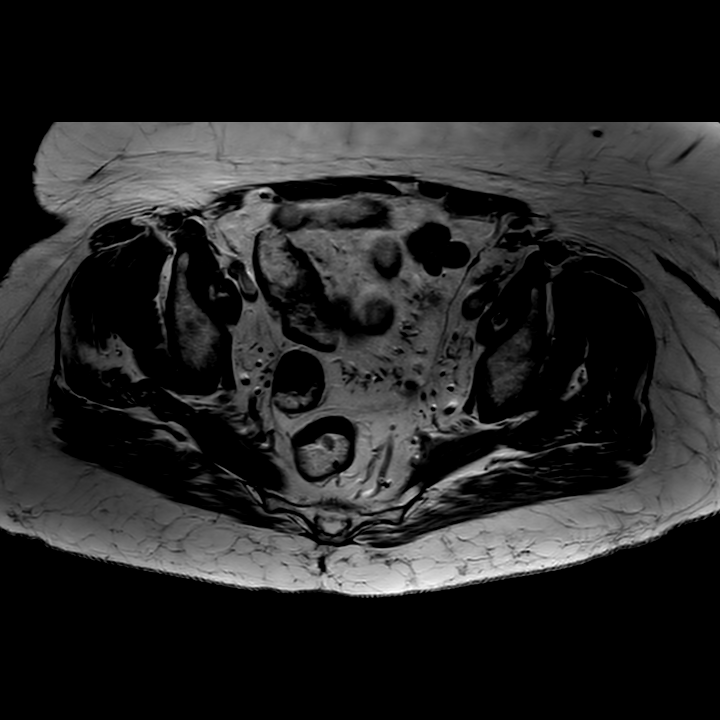
[im 39/44]
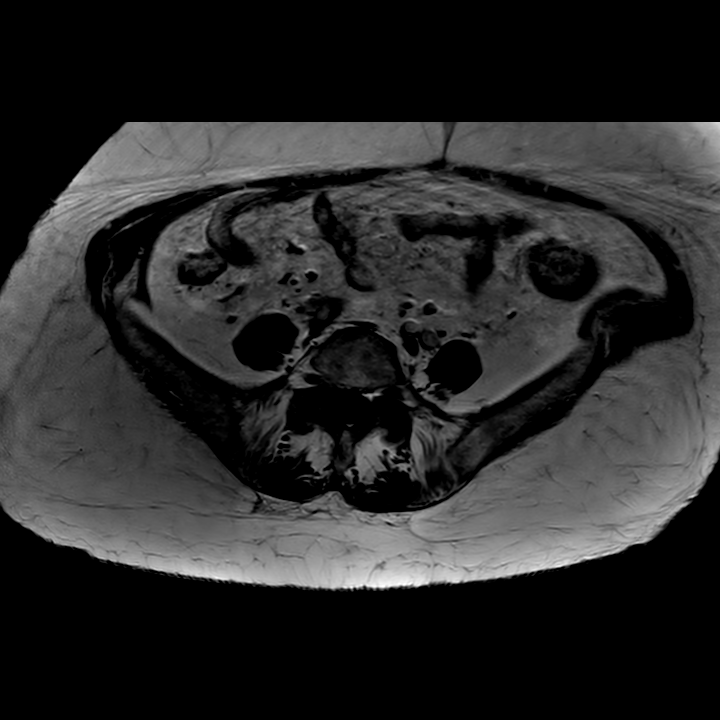

[Series 401: PD fat-sat · axial · 4.0mm · 0.35mm/px · z∈[-181,-38]mm · 7 of 32 slices shown (1 of 2)]
[im 1/32]
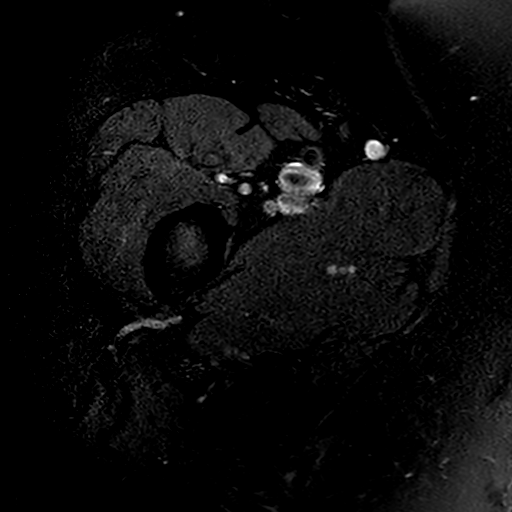
[im 6/32]
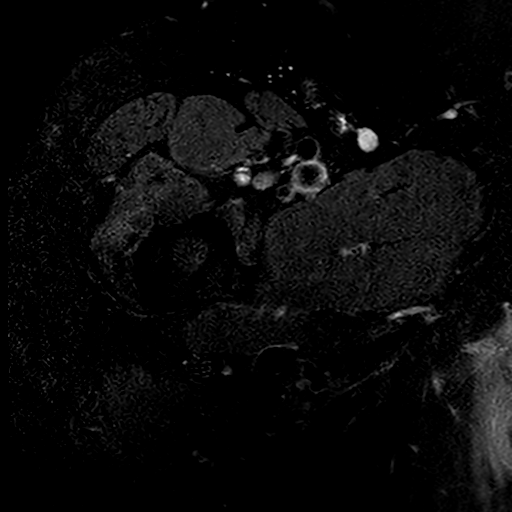
[im 11/32]
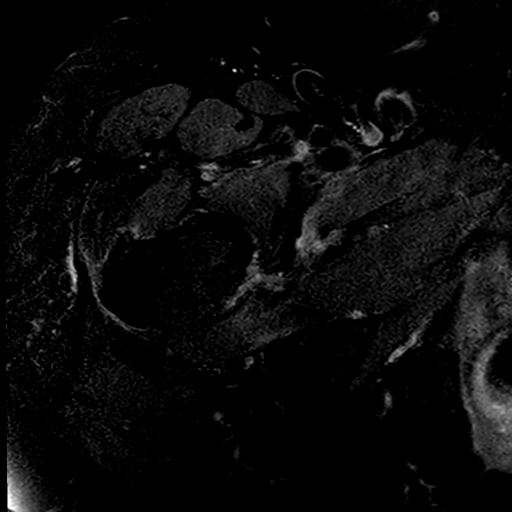
[im 16/32]
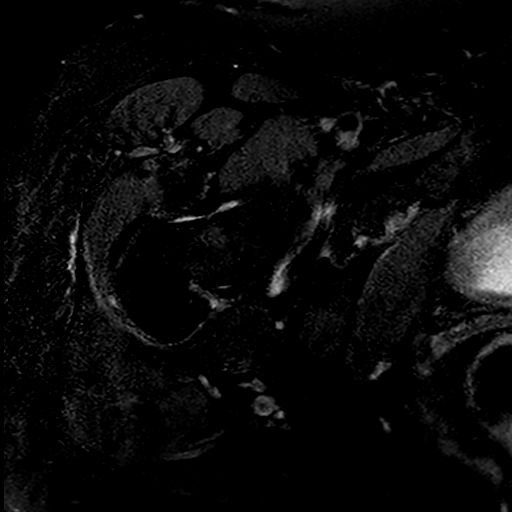
[im 21/32]
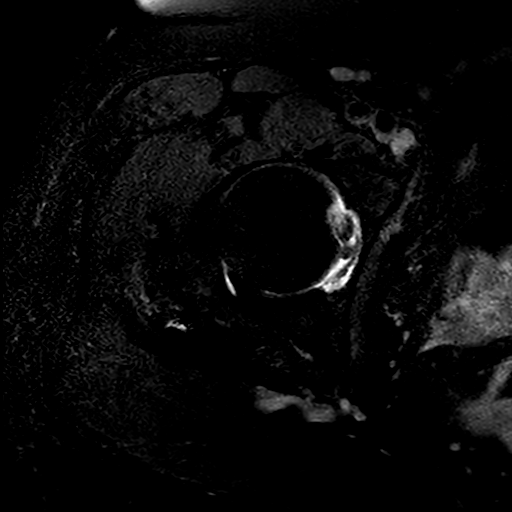
[im 26/32]
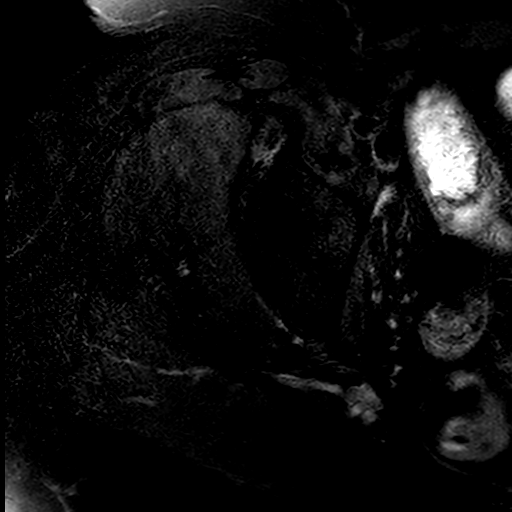
[im 32/32]
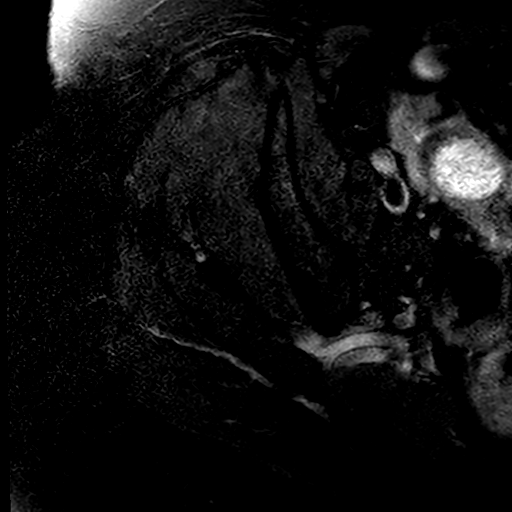

[Series 501: PD · sagittal · 3.5mm · 0.43mm/px · 3 of 36 slices shown]
[im 6/36]
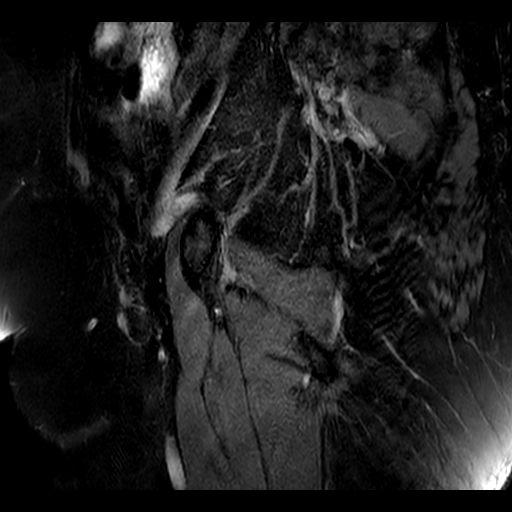
[im 21/36]
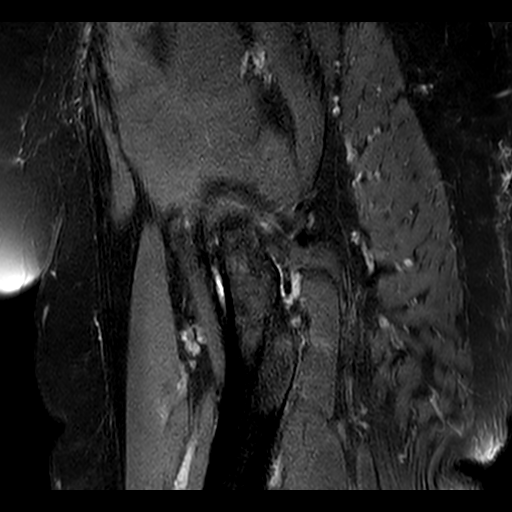
[im 31/36]
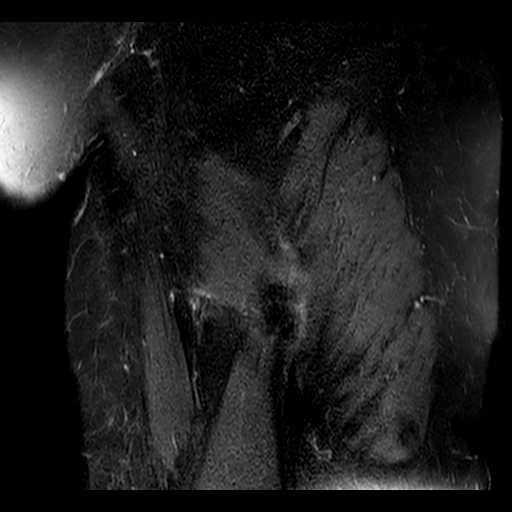

[Series 601: PD fat-sat · coronal · 3.2mm · 0.43mm/px · 3 of 20 slices shown (2 of 2)]
[im 1/20]
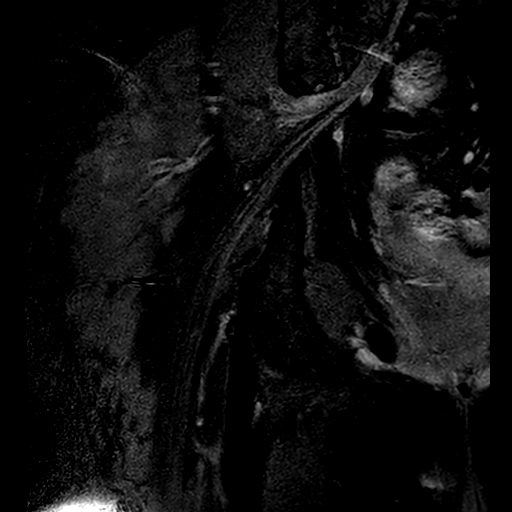
[im 10/20]
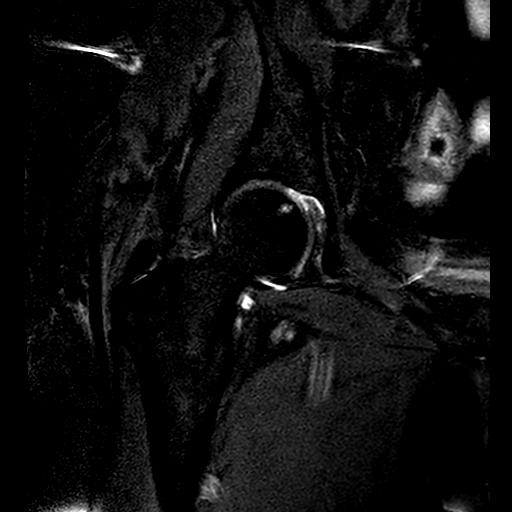
[im 20/20]
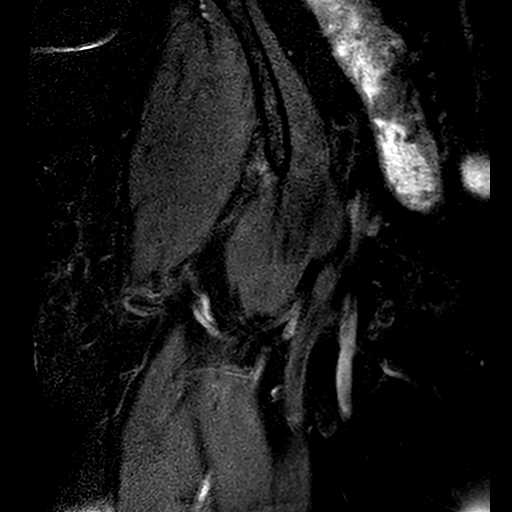

[16 of 40 positions shown; findings below may reference images not displayed]

FINDINGS: HIPS:  There is mild to moderate articular cartilaginous loss right greater than 
left hip with trace subcortical cystic change involving the right femoral head. 
There is mild degenerative labral tearing/fraying. No para labral cyst. No hip 
joint effusion. Both femoral heads maintain a spherical configuration without 
evidence of avascular necrosis or subarticular collapse. No abnormal morphology 
of the proximal femurs or acetabula to predispose to impingement.  
BONES: Normal marrow signal intensity of the proximal hips, pelvis, sacrum and 
included lower lumbar spine. No fracture, contusion or marrow replacing lesion. 
Included lumbar spine demonstrates multilevel degenerative change. SI joints 
show mild degenerative change. 
SOFT TISSUES: There is partial tearing of the right abductor cuff with 
intrasubstance delamination within the right gluteus medius with mild 
peritendinous/peritrochanteric edema. There is asymmetric atrophy of the 
anterolateral portion of the right gluteus medius medius on compared to the 
left. The insertions of the iliopsoas tendons are intact. The origins of the 
hamstrings are preserved. Rectus abdominis-adductor complex is preserved. 
Included neurovascular bundles are negative. Intrapelvic contents include 
sigmoid diverticula without acute diverticulitis.
IMPRESSION: 1.  Partial tearing of the right abductor cuff with mild 
peritendinous/peritrochanteric edema. 
2.  Scattered degenerative changes including mild to moderate involvement of the 
right hip.

## 2022-11-12 IMAGING — MG MAMMOGRAPHY SCREENING BILATERAL 3[PERSON_NAME]
8 series · 8 of 24 positions shown · non-contrast
Comparison: 11/06/2021 and dating back to 09/21/2019

________________________________________________________________________________________________ 
MAMMOGRAPHY SCREENING BILATERAL 3MANJINDER KAUFMANN, 11/12/2022 [DATE]: 
CLINICAL INDICATION: Encounter for screening mammogram.
TECHNIQUE: Digital bilateral mammograms and 3-D Tomosynthesis were obtained. 
These were interpreted both primarily and with the aid of computer-aided 
detection system.  
BREAST DENSITY: (Level B) There are scattered areas of fibroglandular density.

[L CC]
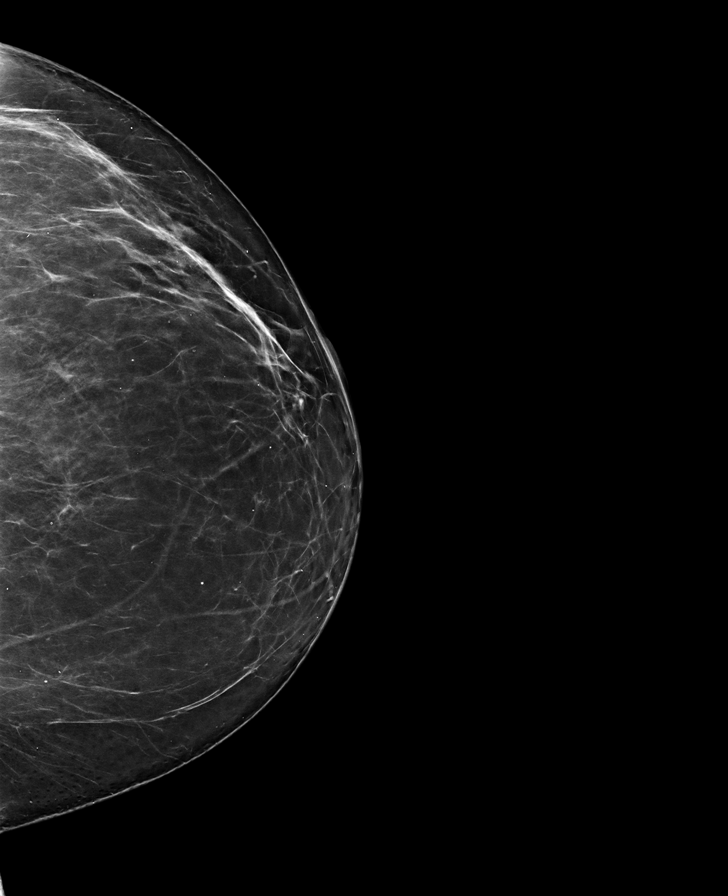

[L MLO]
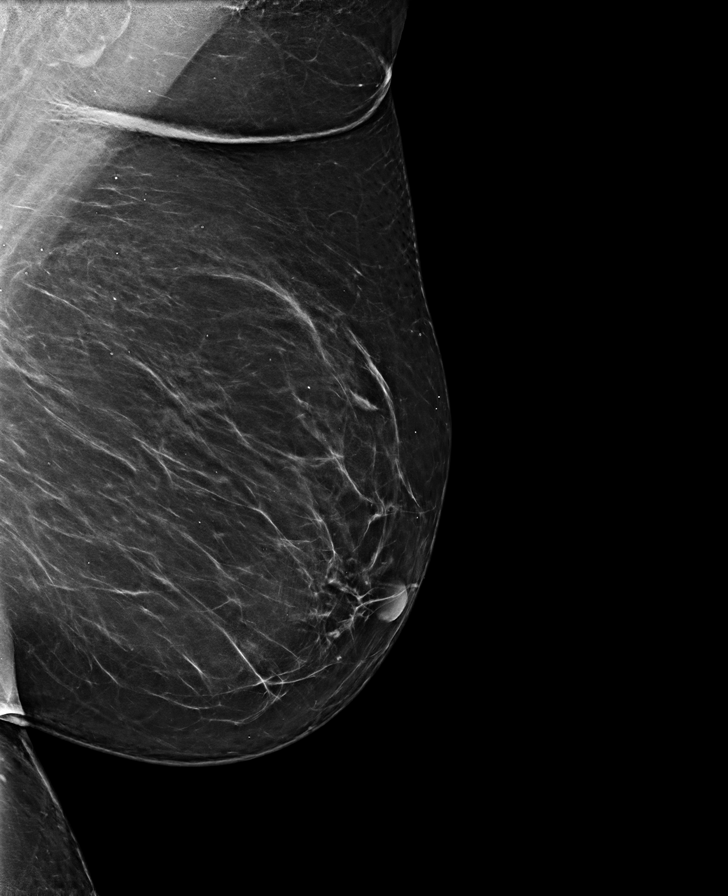

[R CC]
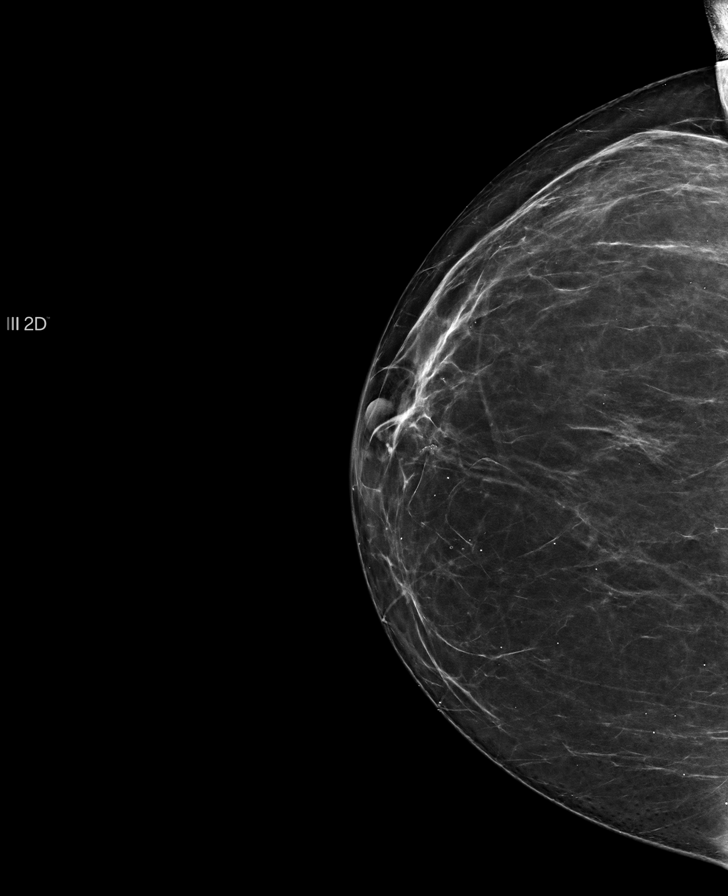

[R MLO]
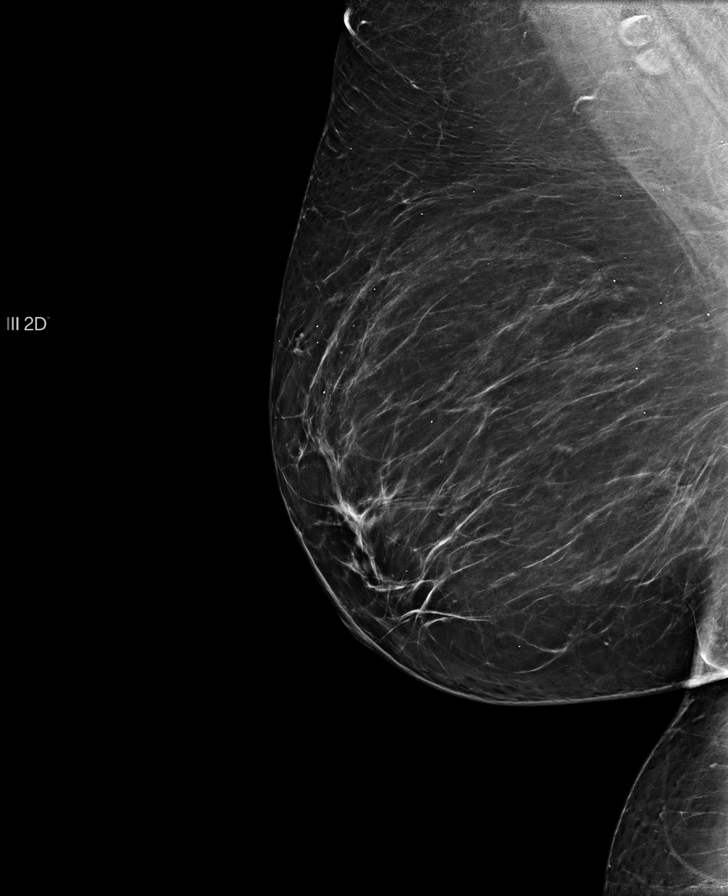

[R MLO tomo · tomo slice 50/99.0]
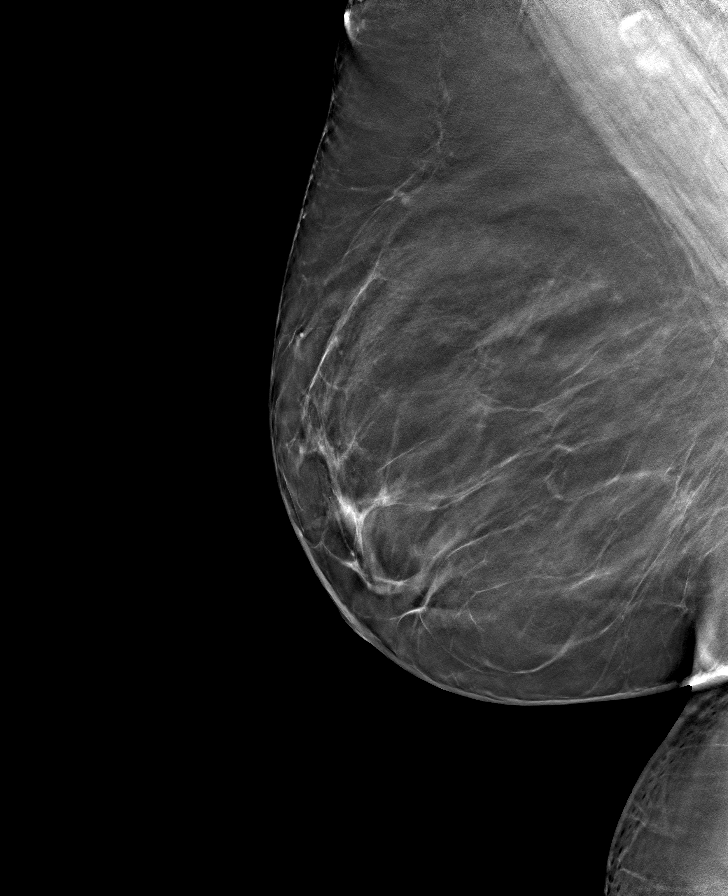

[R CC tomo · tomo slice 40/79.0]
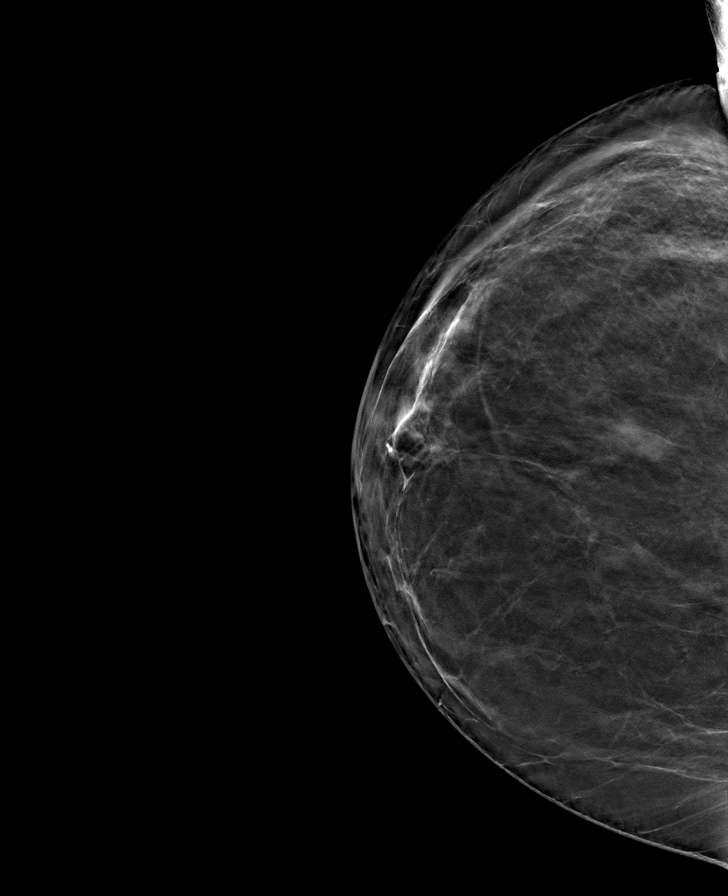

[L MLO tomo · tomo slice 51/100.0]
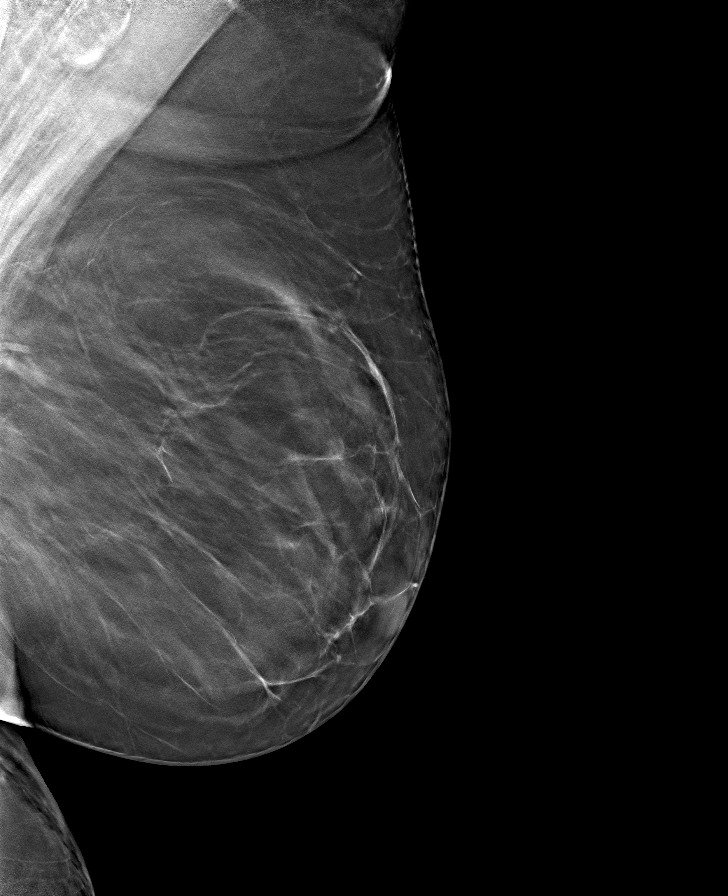

[L CC tomo · tomo slice 42/83.0]
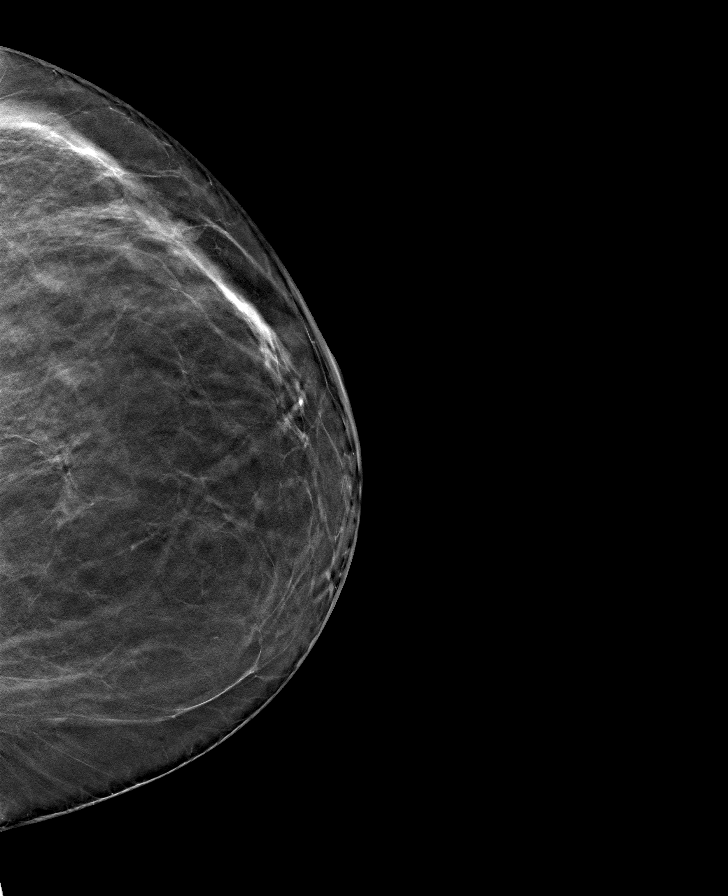

[8 of 24 positions shown; findings below may reference images not displayed]

FINDINGS: No suspicious mass, calcifications, or area of architectural 
distortion in either breast. Overall stable mammographic appearance.
IMPRESSION: No mammographic findings suggestive for malignancy. 
(BI-RADS 2) Benign findings. Routine mammographic follow-up is recommended.

## 2023-08-10 IMAGING — MR MRI RIGHT HIP WITHOUT CONTRAST
4 of 6 series · 17 of 40 positions shown · IV contrast (gadolinium)
Comparison: 06/08/2022

________________________________________________________________________________________________ 
MRI RIGHT HIP WITHOUT CONTRAST, 08/10/2023 [DATE]: 
CLINICAL INDICATION: Trochanteric Bursitis, Right Hip
TECHNIQUE: Multiplanar, multiecho position MR images of the pelvis and right hip 
were performed without intravenous gadolinium enhancement. Small field-of-view 
imaging was performed of the hip.

[Series 301: T1 · axial · 5.0mm · 0.59mm/px · z∈[-152,+62]mm · 3 of 44 slices shown]
[im 5/44]
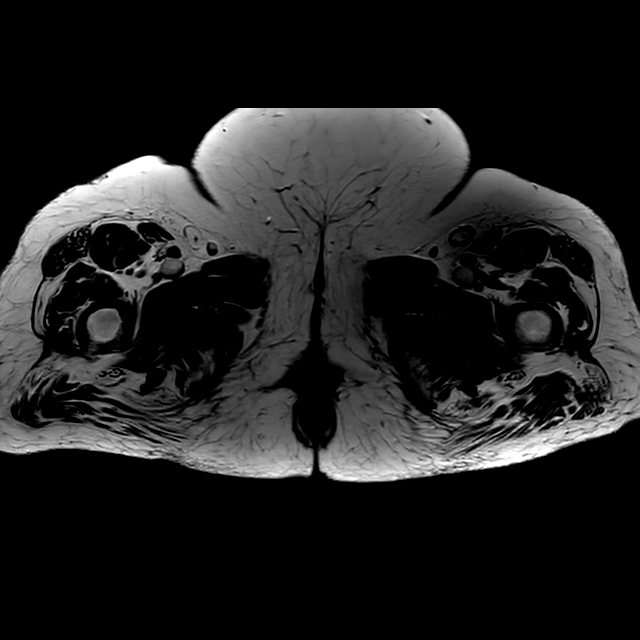
[im 24/44]
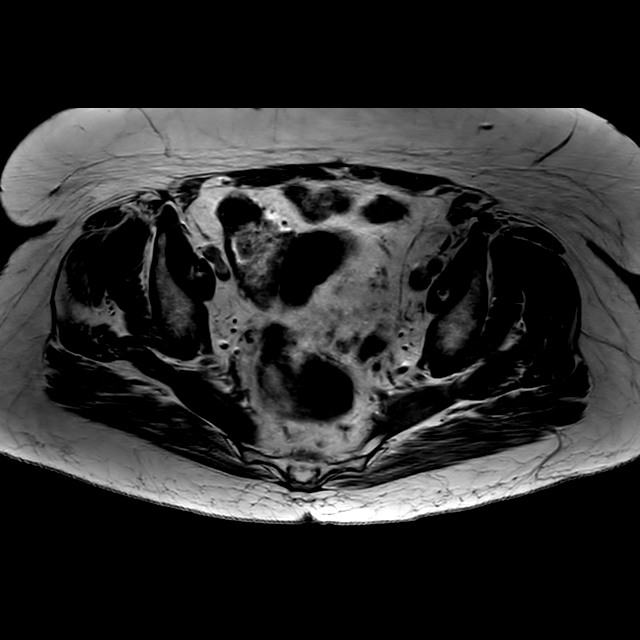
[im 39/44]
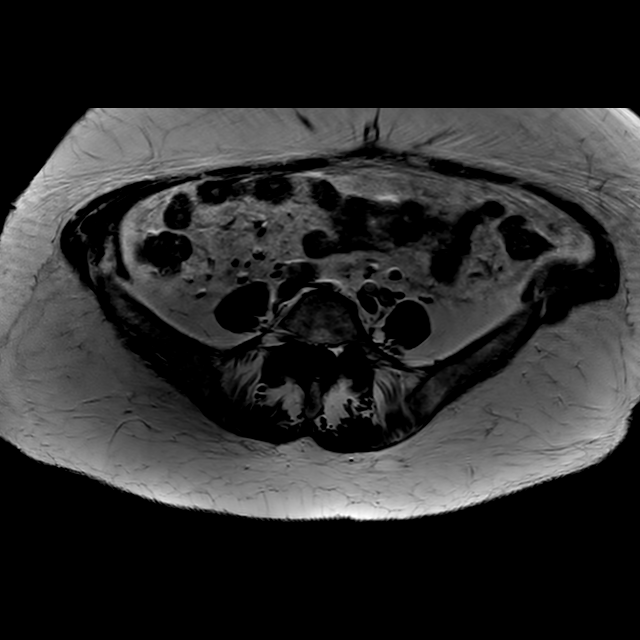

[Series 501: PD fat-sat · axial · 4.0mm · 0.35mm/px · z∈[-165,-23]mm · 7 of 32 slices shown (1 of 2)]
[im 1/32]
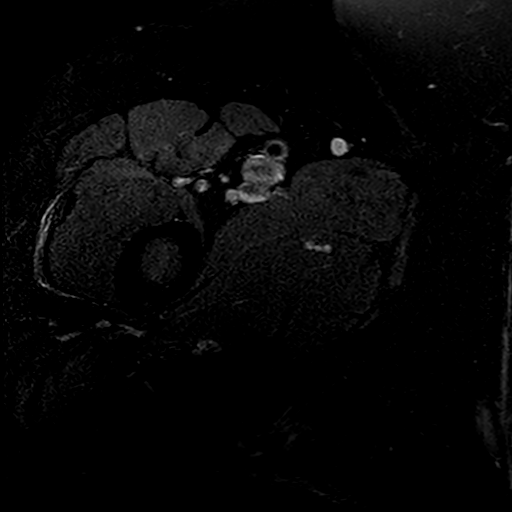
[im 6/32]
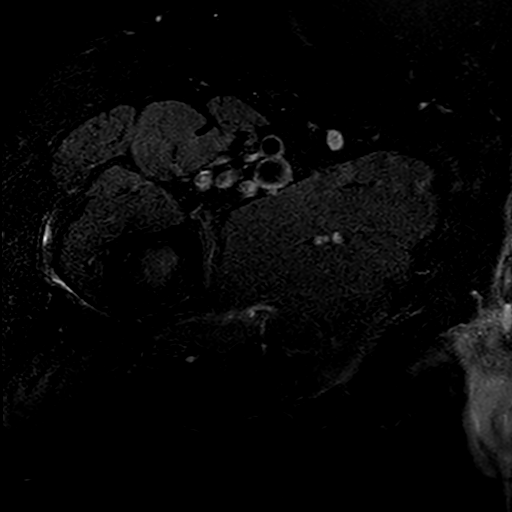
[im 11/32]
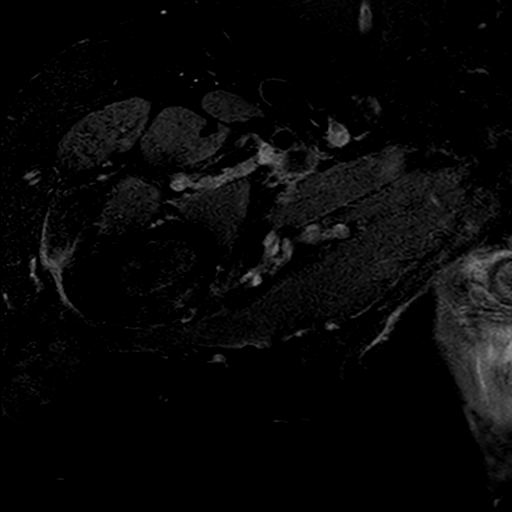
[im 16/32]
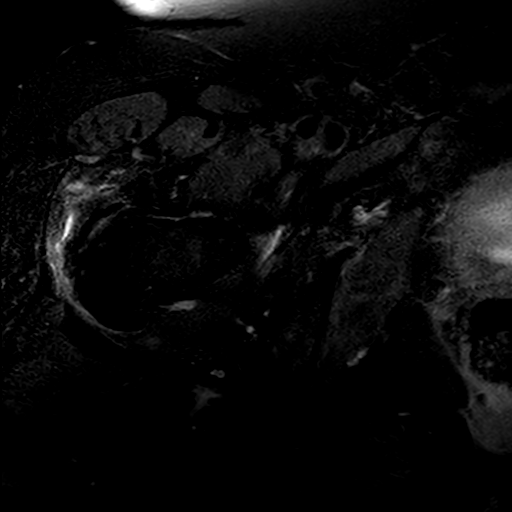
[im 21/32]
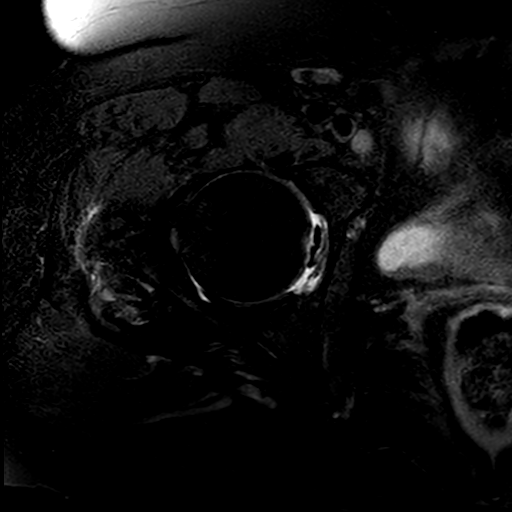
[im 26/32]
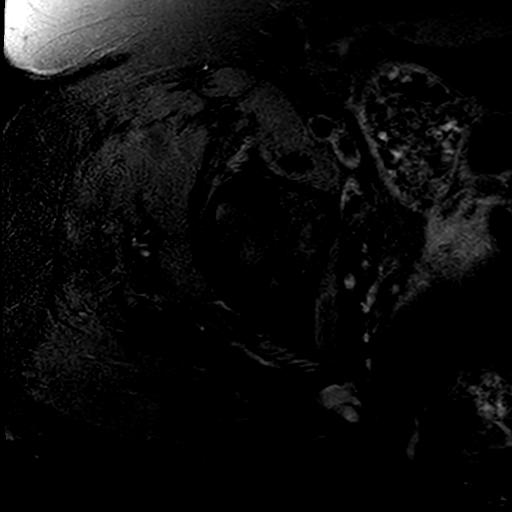
[im 32/32]
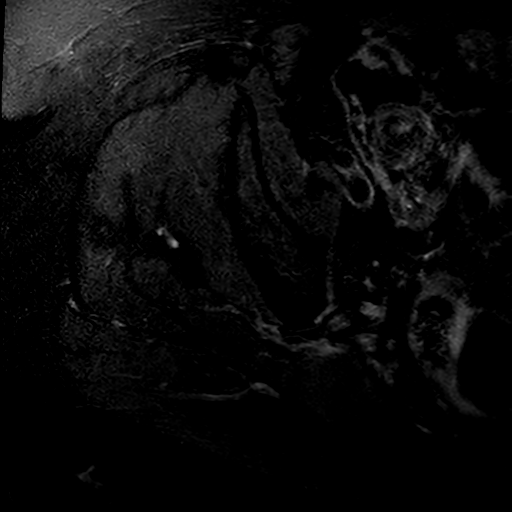

[Series 601: PD fat-sat · coronal · 3.2mm · 0.43mm/px · 4 of 20 slices shown (2 of 2)]
[im 1/20]
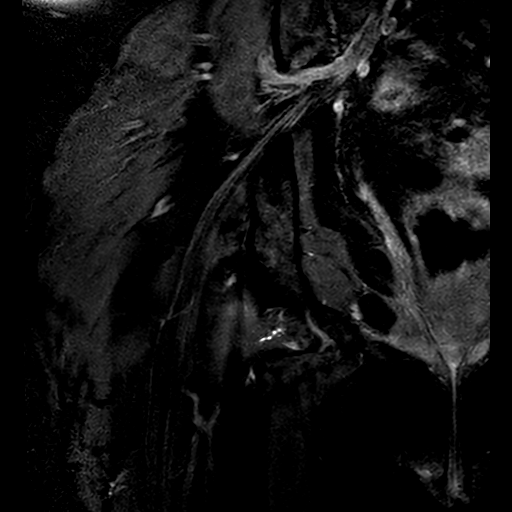
[im 7/20]
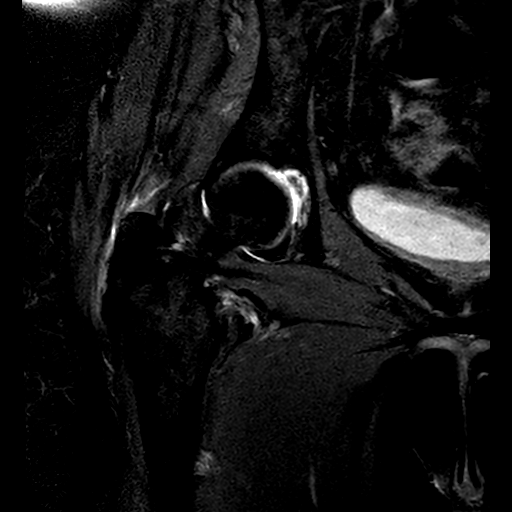
[im 13/20]
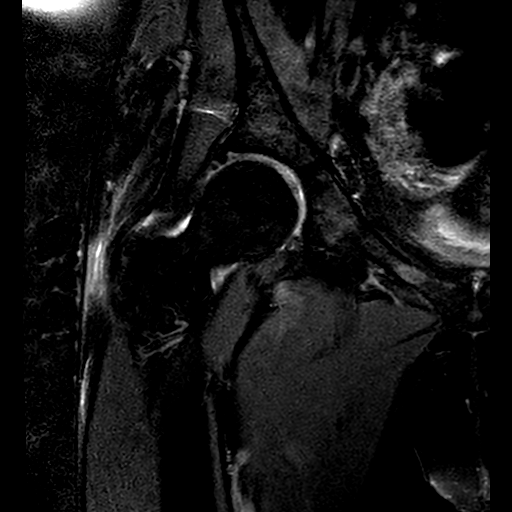
[im 20/20]
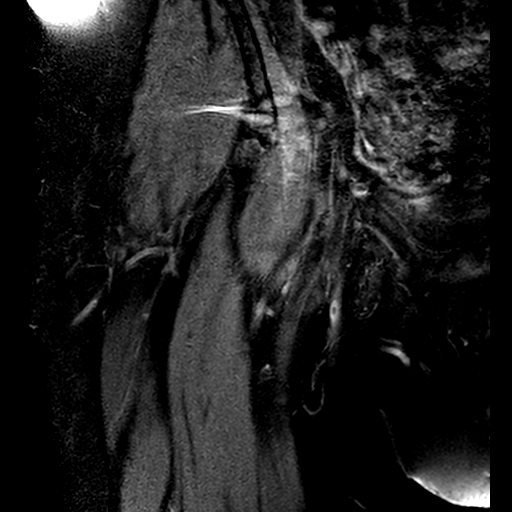

[Series 701: PD · sagittal · 3.5mm · 0.43mm/px · 3 of 36 slices shown]
[im 6/36]
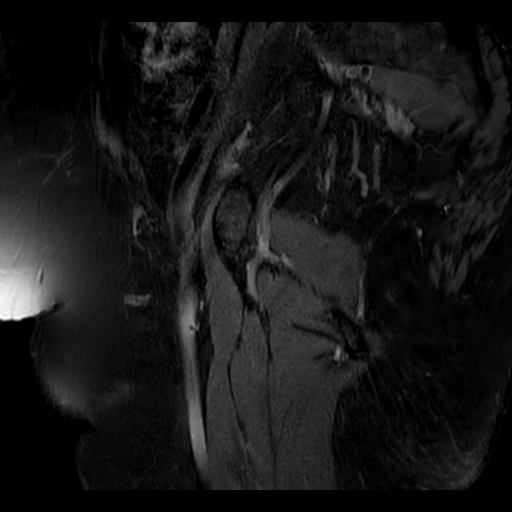
[im 21/36]
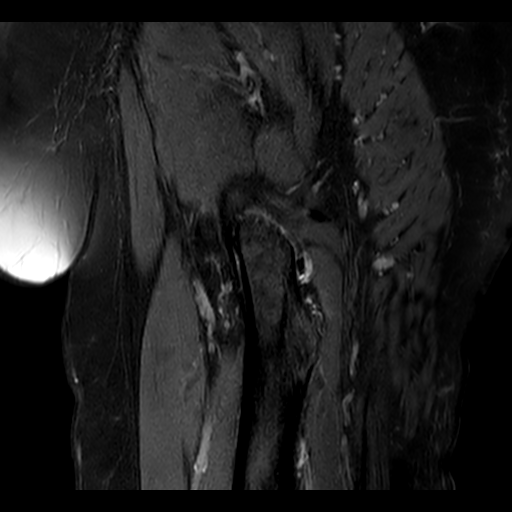
[im 31/36]
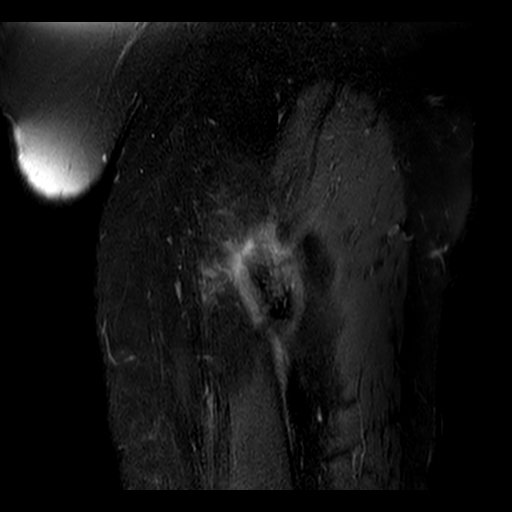

[17 of 40 positions shown; findings below may reference images not displayed]

FINDINGS: HIPS: Mild degenerative change of the the right hip with partial thickness 
chondromalacia, 0.3 cm superior femoral head subcortical cysts and small 
acetabular osteophytes. No labral tear. No paralabral cyst. Small right hip 
joint effusion. Small left acetabular osteophytes. Both femoral heads maintain a 
spherical configuration without evidence of avascular necrosis or subarticular 
collapse. No abnormal morphology of the proximal femurs or acetabulum to 
predispose to impingement. 
PELVIC BONES: Normal marrow signal intensity. No fracture, contusion or marrow 
replacing lesion.  
SI JOINTS: Mild degenerative change. 
PUBIC SYMPHYSIS: Preserved. 
SPINE: Multilevel degenerative change of the spine. 
SOFT TISSUES: Partial thickness tears and tendinosis of the right gluteus 
medius/minimus tendons with small amount of peritendinous/peritrochanteric 
fluid. No trochanteric bursitis. Focal marked right gluteus medius fatty 
muscular atrophy. Bilateral hamstring origin tendinosis.. The rectus 
abdominis-adductor aponeurotic complexes are intact. No mass, free fluid or 
adenopathy. Colonic diverticulosis. Hysterectomy. The bladder is unremarkable.
IMPRESSION: 1.  Mild right hip degenerative change and small joint effusion.   
2.  Partial thickness tears/tendinosis of the right gluteus medius/minimus 
tendons, small amount of peritendinous/peritrochanteric fluid and focal marked 
right gluteus medius fatty muscular atrophy.  
3.  Minimal degenerative change of the left hip.  
4.  Degenerative change of the SI joints and spine. 
5.  Colonic diverticulosis.  
6.  Hysterectomy.

## 2023-12-21 IMAGING — MG MAMMOGRAPHY SCREENING BILATERAL 3[PERSON_NAME]
6 series · 6 of 18 positions shown · non-contrast
Comparison: Comparison was made to prior examinations.

________________________________________________________________________________________________ 
MAMMOGRAPHY SCREENING BILATERAL 3SILVESTER MOSKOWITZ, 12/21/2023 [DATE]: 
CLINICAL INDICATION: Encounter for screening mammogram.
TECHNIQUE: Digital bilateral mammograms and 3-D Tomosynthesis were obtained. 
These were interpreted both primarily and with the aid of computer-aided 
detection system.  
BREAST DENSITY: (Level B) There are scattered areas of fibroglandular density.

[L MLO]
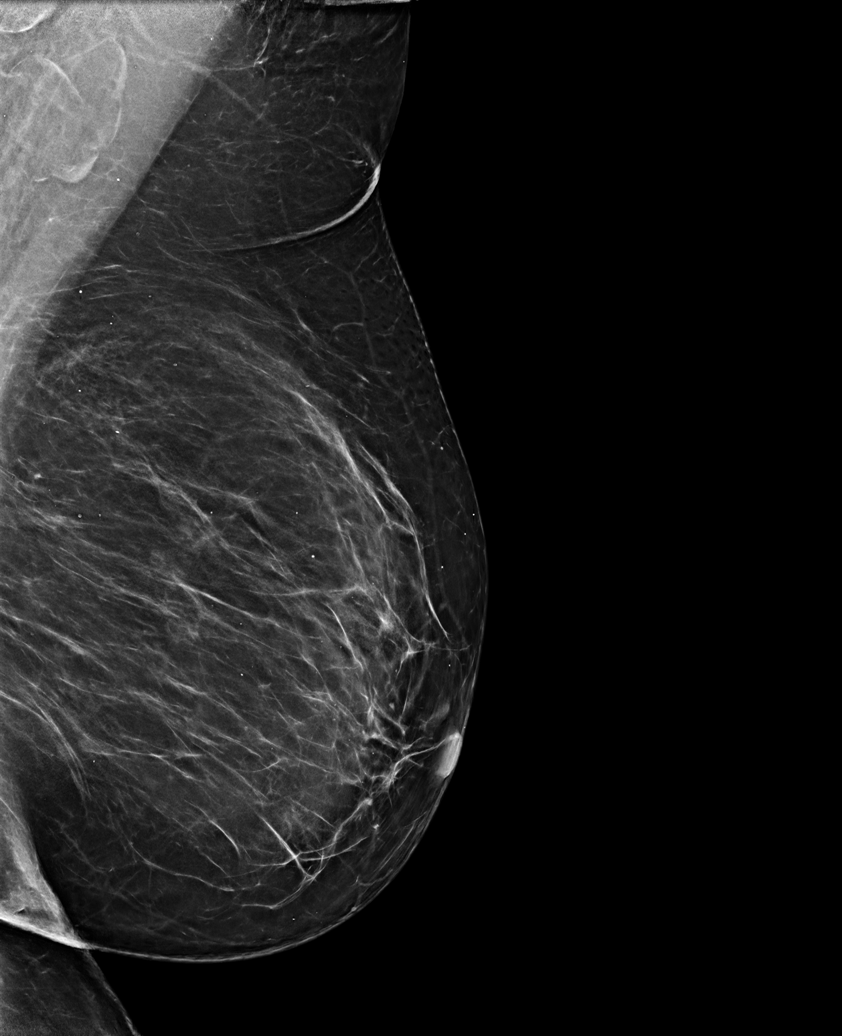

[R CC]
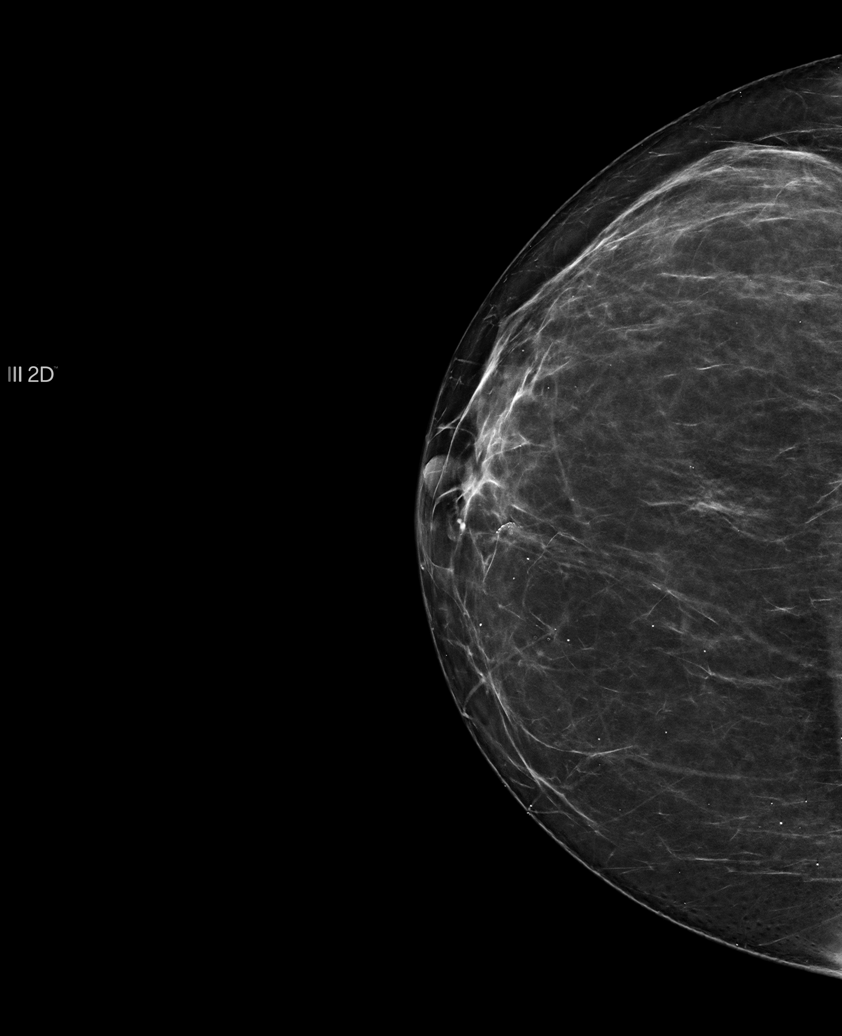

[R MLO]
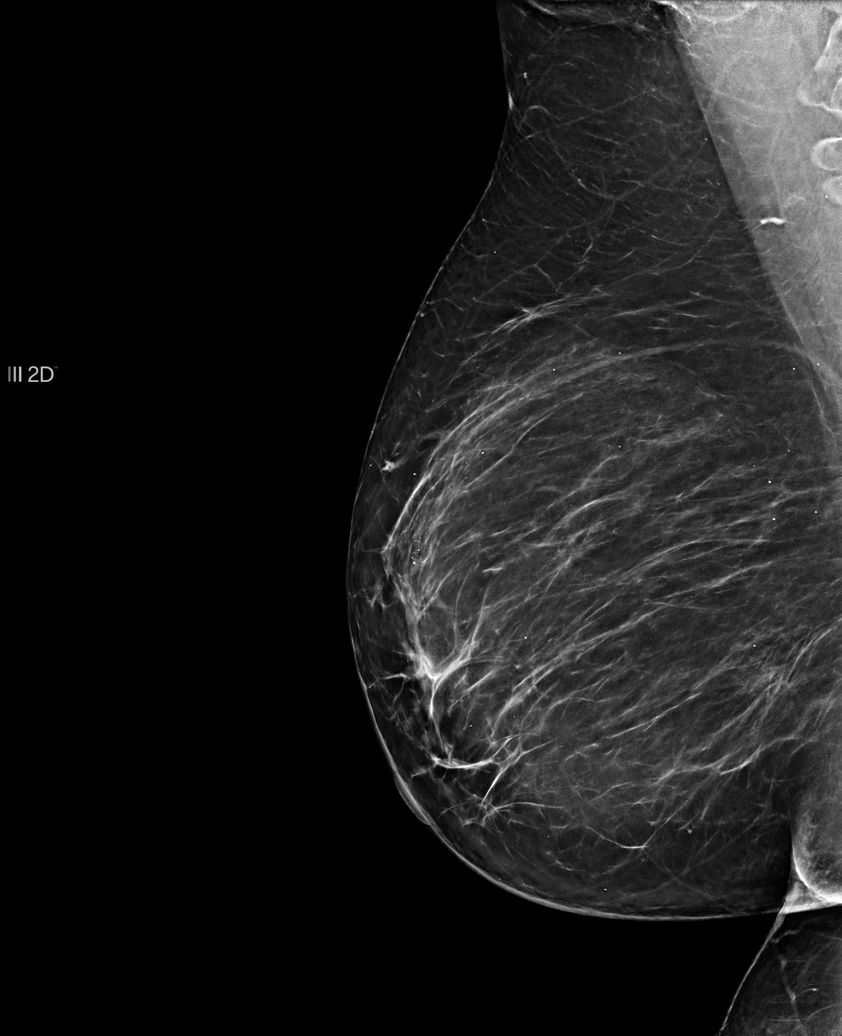

[R CC tomo · tomo slice 13/24.0]
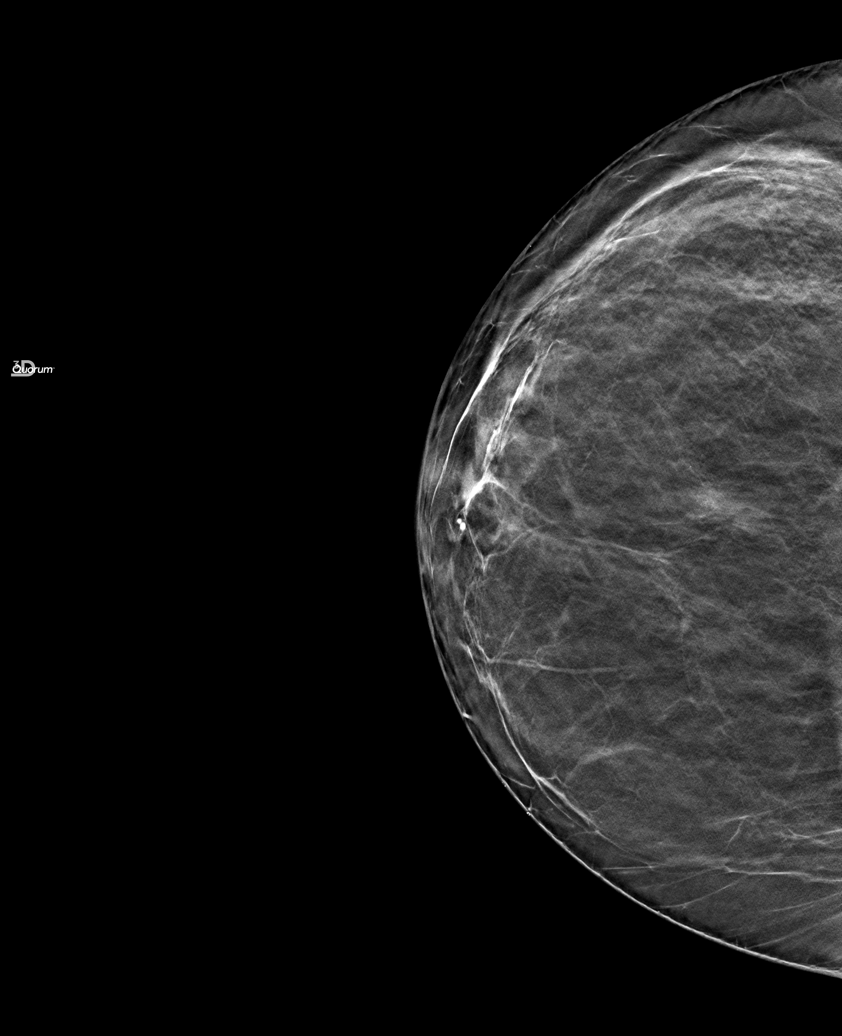

[R MLO tomo · tomo slice 15/29.0]
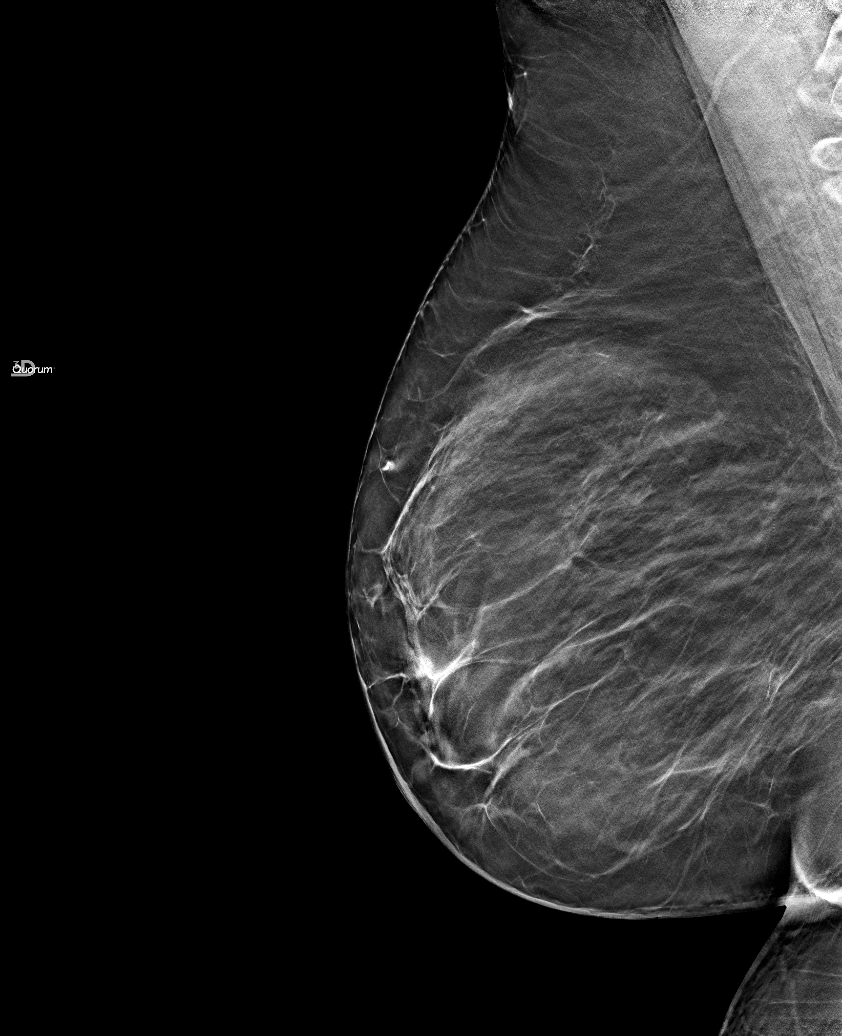

[L MLO tomo · tomo slice 16/31.0]
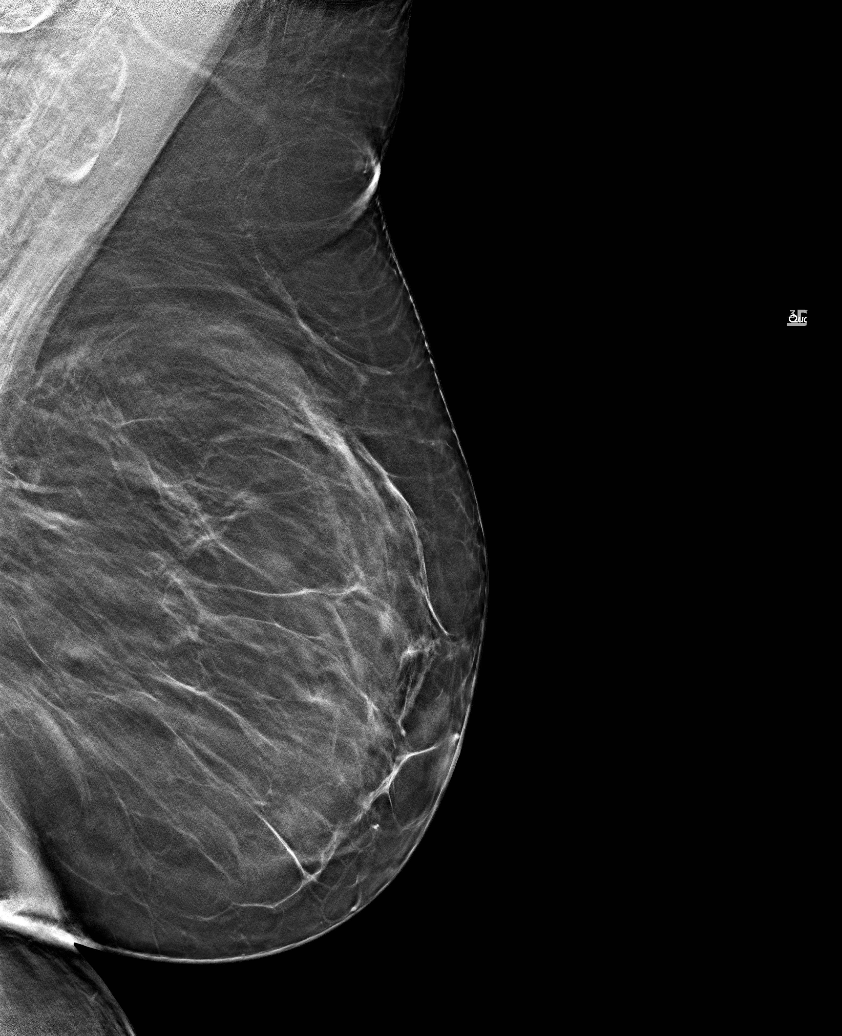

[6 of 18 positions shown; findings below may reference images not displayed]

FINDINGS: No suspicious mass, calcifications, or area of architectural 
distortion in either breast.
IMPRESSION: (BI-RADS 2) Benign findings. Routine mammographic follow-up is recommended.

## 8387-04-19 DEATH — deceased
